# Patient Record
Sex: Male | Born: 1980 | Race: White | Hispanic: No | Marital: Single | Smoking: Never smoker
Health system: Southern US, Community
[De-identification: ages and names within clinical notes are randomized; demographics above are authoritative.]

## PROBLEM LIST (undated history)

## (undated) DIAGNOSIS — Z87442 Personal history of urinary calculi: Secondary | ICD-10-CM

## (undated) DIAGNOSIS — M109 Gout, unspecified: Secondary | ICD-10-CM

## (undated) DIAGNOSIS — E559 Vitamin D deficiency, unspecified: Secondary | ICD-10-CM

## (undated) DIAGNOSIS — T7840XA Allergy, unspecified, initial encounter: Secondary | ICD-10-CM

## (undated) DIAGNOSIS — E669 Obesity, unspecified: Secondary | ICD-10-CM

## (undated) DIAGNOSIS — N289 Disorder of kidney and ureter, unspecified: Secondary | ICD-10-CM

## (undated) DIAGNOSIS — J45909 Unspecified asthma, uncomplicated: Secondary | ICD-10-CM

## (undated) HISTORY — PX: NO PAST SURGERIES: SHX2092

## (undated) HISTORY — DX: Allergy, unspecified, initial encounter: T78.40XA

## (undated) HISTORY — DX: Gout, unspecified: M10.9

## (undated) HISTORY — DX: Vitamin D deficiency, unspecified: E55.9

## (undated) HISTORY — DX: Unspecified asthma, uncomplicated: J45.909

---

## 2007-11-11 ENCOUNTER — Ambulatory Visit: Payer: Self-pay | Admitting: Family Medicine

## 2010-12-10 ENCOUNTER — Emergency Department: Payer: Self-pay

## 2015-03-06 ENCOUNTER — Emergency Department
Admission: EM | Admit: 2015-03-06 | Discharge: 2015-03-06 | Disposition: A | Discharge status: Home / Self Care | Payer: Managed Care, Other (non HMO) | Attending: Emergency Medicine | Admitting: Emergency Medicine

## 2015-03-06 DIAGNOSIS — M109 Gout, unspecified: Secondary | ICD-10-CM

## 2015-03-06 DIAGNOSIS — M10071 Idiopathic gout, right ankle and foot: Secondary | ICD-10-CM | POA: Diagnosis not present

## 2015-03-06 DIAGNOSIS — M79674 Pain in right toe(s): Secondary | ICD-10-CM

## 2015-03-06 DIAGNOSIS — M79671 Pain in right foot: Secondary | ICD-10-CM | POA: Diagnosis present

## 2015-03-06 MED ORDER — IBUPROFEN 800 MG PO TABS
800.0000 mg | ORAL_TABLET | Freq: Once | ORAL | Status: AC
  Administered 2015-03-06: 800 mg via ORAL
  Filled 2015-03-06: qty 1

## 2015-03-06 MED ORDER — IBUPROFEN 800 MG PO TABS: 800.0000 mg | ORAL_TABLET | Freq: Three times a day (TID) | ORAL | Status: DC | PRN

## 2015-03-06 MED ORDER — OXYCODONE-ACETAMINOPHEN 5-325 MG PO TABS
1.0000 | ORAL_TABLET | Freq: Once | ORAL | Status: AC
  Administered 2015-03-06: 1 via ORAL
  Filled 2015-03-06: qty 1

## 2015-03-06 MED ORDER — OXYCODONE-ACETAMINOPHEN 5-325 MG PO TABS: 1.0000 | ORAL_TABLET | ORAL | Status: DC | PRN

## 2015-03-06 NOTE — ED Notes (Signed)
Pt refused to take crutches home. Pt reports having crutches at home.

## 2015-03-06 NOTE — ED Provider Notes (Signed)
[facility] Emergency Department Provider Note  ____________________________________________  Time seen: Approximately 2:57 AM  I have reviewed the triage vital signs and the nursing notes.   HISTORY  Chief Complaint Foot Pain    HPI Cameron Thompson is a 35 y.o. male who presents to the ED from home with a chief complaint of right foot pain. Patient works 2 jobs, one which requires him to wear steel-toed boots, who noted pain and burning to his right great toe upon awakening last evening approximately 8:15PM.Patient denies trauma, fall, injury. Denies associated fever, chills, chest pain, shortness of breath, abdominal pain, nausea, vomiting, diarrhea. Denies prior similar symptoms. Nothing makes his pain better. Walking makes his pain worse.  Past medical history None  There are no active problems to display for this patient.   History reviewed. No pertinent past surgical history.  No current outpatient prescriptions on file.  Allergies Kiwi extract  History reviewed. No pertinent family history.  Social History Social History  Substance Use Topics  . Smoking status: Never Smoker   . Smokeless tobacco: None  . Alcohol Use: No    Review of Systems Constitutional: No fever/chills. Eyes: No visual changes. ENT: No sore throat. Cardiovascular: Denies chest pain. Respiratory: Denies shortness of breath. Gastrointestinal: No abdominal pain.  No nausea, no vomiting.  No diarrhea.  No constipation. Genitourinary: Negative for dysuria. Musculoskeletal: Positive for right great toe pain. Negative for back pain. Skin: Negative for rash. Neurological: Negative for headaches, focal weakness or numbness.  10-point ROS otherwise negative.  ____________________________________________   PHYSICAL EXAM:  VITAL SIGNS: ED Triage Vitals  Enc Vitals Group     BP 03/06/15 0134 142/94 mmHg     Pulse Rate 03/06/15 0134 96     Resp 03/06/15 0134 18    Temp 03/06/15 0134 97.6 F (36.4 C)     Temp Source 03/06/15 0134 Oral     SpO2 03/06/15 0134 97 %     Weight 03/06/15 0134 280 lb (127.007 kg)     Height 03/06/15 0134  (1.778 m)     Head Cir --      Peak Flow --      Pain Score 03/06/15 0135 8     Pain Loc --      Pain Edu? --      Excl. in GC? --     Constitutional: Alert and oriented. Well appearing and in no acute distress. Eyes: Conjunctivae are normal. PERRL. EOMI. Head: Atraumatic. Nose: No congestion/rhinnorhea. Mouth/Throat: Mucous membranes are moist.  Oropharynx non-erythematous. Neck: No stridor.   Cardiovascular: Normal rate, regular rhythm. Grossly normal heart sounds.  Good peripheral circulation. Respiratory: Normal respiratory effort.  No retractions. Lungs CTAB. Gastrointestinal: Soft and nontender. No distention. No abdominal bruits. No CVA tenderness. Musculoskeletal: Right metatarsal with mild warmth, erythema and tenderness to palpation. Full range of motion with moderate pain. No joint effusions. 2+ distal pulses. Supple calf without evidence for compartment syndrome. Symmetrically warm limb without evidence for ischemia. Neurologic:  Normal speech and language. No gross focal neurologic deficits are appreciated.  Skin:  Skin is warm, dry and intact. No rash noted. Psychiatric: Mood and affect are normal. Speech and behavior are normal.  ____________________________________________   LABS (all labs ordered are listed, but only abnormal results are displayed)  Labs Reviewed - No data to display ____________________________________________  EKG  None ____________________________________________  RADIOLOGY  None ____________________________________________   PROCEDURES  Procedure(s) performed: None  Critical Care performed: No  ____________________________________________   INITIAL IMPRESSION / ASSESSMENT AND PLAN / ED COURSE  Pertinent labs & imaging results that were available  during my care of the patient were reviewed by me and considered in my medical decision making (see chart for details).  35 year old male who presents with pain in his right great toe consistent with gout. Will start NSAIDs, analgesia and follow-up with his PCP. Strict return process given. Patient verbalizes understanding and agrees with plan of care. ____________________________________________   FINAL CLINICAL IMPRESSION(S) / ED DIAGNOSES  Final diagnoses:  Acute gout of right foot, unspecified cause  Great toe pain, right      Irean Hong, MD 03/06/15 [phone]

## 2015-03-06 NOTE — Discharge Instructions (Signed)
1. You may take pain medicines as needed (Motrin/Percocet #15). 2. Use crutches as needed to ambulate. 3. Return to the ER for worsening symptoms, increased redness/swelling, fever or other concerns.  Gout Gout is an inflammatory arthritis caused by a buildup of uric acid crystals in the joints. Uric acid is a chemical that is normally present in the blood. When the level of uric acid in the blood is too high it can form crystals that deposit in your joints and tissues. This causes joint redness, soreness, and swelling (inflammation). Repeat attacks are common. Over time, uric acid crystals can form into masses (tophi) near a joint, destroying bone and causing disfigurement. Gout is treatable and often preventable. CAUSES  The disease begins with elevated levels of uric acid in the blood. Uric acid is produced by your body when it breaks down a naturally found substance called purines. Certain foods you eat, such as meats and fish, contain high amounts of purines. Causes of an elevated uric acid level include:  Being passed down from parent to child (heredity).  Diseases that cause increased uric acid production (such as obesity, psoriasis, and certain cancers).  Excessive alcohol use.  Diet, especially diets rich in meat and seafood.  Medicines, including certain cancer-fighting medicines (chemotherapy), water pills (diuretics), and aspirin.  Chronic kidney disease. The kidneys are no longer able to remove uric acid well.  Problems with metabolism. Conditions strongly associated with gout include:  Obesity.  High blood pressure.  High cholesterol.  Diabetes. Not everyone with elevated uric acid levels gets gout. It is not understood why some people get gout and others do not. Surgery, joint injury, and eating too much of certain foods are some of the factors that can lead to gout attacks. SYMPTOMS   An attack of gout comes on quickly. It causes intense pain with redness, swelling,  and warmth in a joint.  Fever can occur.  Often, only one joint is involved. Certain joints are more commonly involved:  Base of the big toe.  Knee.  Ankle.  Wrist.  Finger. Without treatment, an attack usually goes away in a few days to weeks. Between attacks, you usually will not have symptoms, which is different from many other forms of arthritis. DIAGNOSIS  Your caregiver will suspect gout based on your symptoms and exam. In some cases, tests may be recommended. The tests may include:  Blood tests.  Urine tests.  X-rays.  Joint fluid exam. This exam requires a needle to remove fluid from the joint (arthrocentesis). Using a microscope, gout is confirmed when uric acid crystals are seen in the joint fluid. TREATMENT  There are two phases to gout treatment: treating the sudden onset (acute) attack and preventing attacks (prophylaxis).  Treatment of an Acute Attack.  Medicines are used. These include anti-inflammatory medicines or steroid medicines.  An injection of steroid medicine into the affected joint is sometimes necessary.  The painful joint is rested. Movement can worsen the arthritis.  You may use warm or cold treatments on painful joints, depending which works best for you.  Treatment to Prevent Attacks.  If you suffer from frequent gout attacks, your caregiver may advise preventive medicine. These medicines are started after the acute attack subsides. These medicines either help your kidneys eliminate uric acid from your body or decrease your uric acid production. You may need to stay on these medicines for a very long time.  The early phase of treatment with preventive medicine can be associated with an increase  in acute gout attacks. For this reason, during the first few months of treatment, your caregiver may also advise you to take medicines usually used for acute gout treatment. Be sure you understand your caregiver's directions. Your caregiver may make  several adjustments to your medicine dose before these medicines are effective.  Discuss dietary treatment with your caregiver or dietitian. Alcohol and drinks high in sugar and fructose and foods such as meat, poultry, and seafood can increase uric acid levels. Your caregiver or dietitian can advise you on drinks and foods that should be limited. HOME CARE INSTRUCTIONS   Do not take aspirin to relieve pain. This raises uric acid levels.  Only take over-the-counter or prescription medicines for pain, discomfort, or fever as directed by your caregiver.  Rest the joint as much as possible. When in bed, keep sheets and blankets off painful areas.  Keep the affected joint raised (elevated).  Apply warm or cold treatments to painful joints. Use of warm or cold treatments depends on which works best for you.  Use crutches if the painful joint is in your leg.  Drink enough fluids to keep your urine clear or pale yellow. This helps your body get rid of uric acid. Limit alcohol, sugary drinks, and fructose drinks.  Follow your dietary instructions. Pay careful attention to the amount of protein you eat. Your daily diet should emphasize fruits, vegetables, whole grains, and fat-free or low-fat milk products. Discuss the use of coffee, vitamin C, and cherries with your caregiver or dietitian. These may be helpful in lowering uric acid levels.  Maintain a healthy body weight. SEEK MEDICAL CARE IF:   You develop diarrhea, vomiting, or any side effects from medicines.  You do not feel better in 24 hours, or you are getting worse. SEEK IMMEDIATE MEDICAL CARE IF:   Your joint becomes suddenly more tender, and you have chills or a fever. MAKE SURE YOU:   Understand these instructions.  Will watch your condition.  Will get help right away if you are not doing well or get worse.   This information is not intended to replace advice given to you by your health care provider. Make sure you discuss  any questions you have with your health care provider.   Document Released: 12/27/1999 Document Revised: 01/19/2014 Document Reviewed: 08/12/2011 Elsevier Interactive Patient Education [employer]

## 2015-03-06 NOTE — ED Notes (Signed)
Pt arrived to ED with c/o right foot pain. Pt reports "burning" pain at right great toe. Pt denies injury

## 2015-03-06 NOTE — ED Notes (Signed)
MD Sung at bedside. 

## 2015-11-19 ENCOUNTER — Ambulatory Visit
Admission: EM | Admit: 2015-11-19 | Discharge: 2015-11-19 | Disposition: A | Discharge status: Home / Self Care | Payer: Managed Care, Other (non HMO) | Attending: Family Medicine | Admitting: Family Medicine

## 2015-11-19 DIAGNOSIS — J4531 Mild persistent asthma with (acute) exacerbation: Secondary | ICD-10-CM

## 2015-11-19 MED ORDER — AZITHROMYCIN 250 MG PO TABS: ORAL_TABLET | ORAL | 0 refills | Status: DC

## 2015-11-19 MED ORDER — PREDNISONE 20 MG PO TABS: ORAL_TABLET | ORAL | 0 refills | Status: DC

## 2015-11-19 MED ORDER — IPRATROPIUM-ALBUTEROL 0.5-2.5 (3) MG/3ML IN SOLN
3.0000 mL | Freq: Once | RESPIRATORY_TRACT | Status: AC
  Administered 2015-11-19: 3 mL via RESPIRATORY_TRACT

## 2015-11-19 NOTE — ED Triage Notes (Signed)
Pt c/o cough that has been around for about 2 weeks, he says he cant breath and his chest is tight.

## 2015-11-19 NOTE — ED Provider Notes (Signed)
[facility]    CSN: [id number] Arrival date & time: 11/19/15  1356     History   Chief Complaint Chief Complaint  Patient presents with  . Cough    HPI Cameron Thompson is a [age] y.o. male.   The history is provided by the patient.  Cough  Cough characteristics:  Productive Associated symptoms: wheezing   URI  Presenting symptoms: cough and fatigue   Severity:  Moderate Onset quality:  Gradual Duration:  2 weeks Timing:  Constant Progression:  Worsening Chronicity:  New Relieved by:  Prescription medications (mild relief with albuterol inhaler at home) Associated symptoms: wheezing   Risk factors: not elderly, no chronic cardiac disease, no chronic kidney disease, no chronic respiratory disease, no diabetes mellitus, no immunosuppression, no recent illness, no recent travel and no sick contacts     History reviewed. No pertinent past medical history.  There are no active problems to display for this patient.   History reviewed. No pertinent surgical history.     Home Medications    Prior to Admission medications   Medication Sig Start Date End Date Taking? Authorizing Provider  azithromycin (ZITHROMAX Z-PAK) 250 MG tablet 2 tabs po once day 1, then 1 tab po qd for next 4 days 11/19/15   Payton Mccallumrlando Carling Liberman, MD  ibuprofen (ADVIL,MOTRIN) 800 MG tablet Take 1 tablet (800 mg total) by mouth every 8 (eight) hours as needed for moderate pain. 03/06/15   Irean HongJade J Sung, MD  predniSONE (DELTASONE) 20 MG tablet 3 tabs po qd for 2 days, then 2 tabs po qd for 3 days, then 1 tab po qd for 3 days, then half a tab po qd for 2 days 11/19/15   Payton Mccallumrlando Kendan Cornforth, MD    Family History History reviewed. No pertinent family history.  Social History Social History  Substance Use Topics  . Smoking status: Never Smoker  . Smokeless tobacco: Never Used  . Alcohol use No     Allergies   Kiwi extract   Review of Systems Review of Systems  Constitutional: Positive for fatigue.   Respiratory: Positive for cough and wheezing.      Physical Exam Triage Vital Signs ED Triage Vitals  Enc Vitals Group     BP 11/19/15 1534 (!) 158/104     Pulse Rate 11/19/15 1534 91     Resp 11/19/15 1534 20     Temp 11/19/15 1534 98.7 F (37.1 C)     Temp Source 11/19/15 1534 Oral     SpO2 11/19/15 1534 96 %     Weight 11/19/15 1534 (!) 318 lb (144.2 kg)     Height 11/19/15 1534 5\' 9"  (1.753 m)     Head Circumference --      Peak Flow --      Pain Score 11/19/15 1536 9     Pain Loc --      Pain Edu? --      Excl. in GC? --    No data found.   Updated Vital Signs BP (!) 158/104 (BP Location: Left Arm)   Pulse 91   Temp 98.7 F (37.1 C) (Oral)   Resp 20   Ht 5\' 9"  (1.753 m)   Wt (!) 318 lb (144.2 kg)   SpO2 96%   BMI 46.96 kg/m   Visual Acuity Right Eye Distance:   Left Eye Distance:   Bilateral Distance:    Right Eye Near:   Left Eye Near:    Bilateral Near:  Physical Exam  Constitutional: He appears well-developed and well-nourished. No distress.  HENT:  Head: Normocephalic and atraumatic.  Right Ear: Tympanic membrane, external ear and ear canal normal.  Left Ear: Tympanic membrane, external ear and ear canal normal.  Nose: Nose normal.  Mouth/Throat: Uvula is midline, oropharynx is clear and moist and mucous membranes are normal. No oropharyngeal exudate or tonsillar abscesses.  Eyes: Conjunctivae and EOM are normal. Pupils are equal, round, and reactive to light. Right eye exhibits no discharge. Left eye exhibits no discharge. No scleral icterus.  Neck: Normal range of motion. Neck supple. No tracheal deviation present. No thyromegaly present.  Cardiovascular: Normal rate, regular rhythm and normal heart sounds.   Pulmonary/Chest: Effort normal. No stridor. No respiratory distress. He has wheezes (diffuse, bilateral, expiratory). He has no rales. He exhibits no tenderness.  Lymphadenopathy:    He has no cervical adenopathy.  Neurological: He is  alert.  Skin: Skin is warm and dry. No rash noted. He is not diaphoretic.  Nursing note and vitals reviewed.    UC Treatments / Results  Labs (all labs ordered are listed, but only abnormal results are displayed) Labs Reviewed - No data to display  EKG  EKG Interpretation None       Radiology No results found.  Procedures Procedures (including critical care time)  Medications Ordered in UC Medications  ipratropium-albuterol (DUONEB) 0.5-2.5 (3) MG/3ML nebulizer solution 3 mL (3 mLs Nebulization Given 11/19/15 1608)     Initial Impression / Assessment and Plan / UC Course  I have reviewed the triage vital signs and the nursing notes.  Pertinent labs & imaging results that were available during my care of the patient were reviewed by me and considered in my medical decision making (see chart for details).  Clinical Course       Final Clinical Impressions(s) / UC Diagnoses   Final diagnoses:  Mild persistent asthma with exacerbation    New Prescriptions Discharge Medication List as of 11/19/2015  4:21 PM    START taking these medications   Details  azithromycin (ZITHROMAX Z-PAK) 250 MG tablet 2 tabs po once day 1, then 1 tab po qd for next 4 days, Normal    predniSONE (DELTASONE) 20 MG tablet 3 tabs po qd for 2 days, then 2 tabs po qd for 3 days, then 1 tab po qd for 3 days, then half a tab po qd for 2 days, Normal       1. diagnosis reviewed with patient; given duoneb x 1 with improvement 2.  rx as per orders above; reviewed possible side effects, interactions, risks and benefits; continue current home albuterol inhaler  3. Recommend supportive treatment with increased fluids 4. Follow-up prn if symptoms worsen or don't improve   Payton Mccallumrlando Yaw Escoto, MD 11/19/15 1635

## 2015-11-22 ENCOUNTER — Telehealth: Payer: Self-pay | Admitting: *Deleted

## 2016-03-14 ENCOUNTER — Encounter: Payer: Self-pay | Admitting: Emergency Medicine

## 2016-03-14 ENCOUNTER — Emergency Department: Payer: 59

## 2016-03-14 ENCOUNTER — Emergency Department
Admission: EM | Admit: 2016-03-14 | Discharge: 2016-03-14 | Disposition: A | Discharge status: Home / Self Care | Payer: 59 | Attending: Emergency Medicine | Admitting: Emergency Medicine

## 2016-03-14 DIAGNOSIS — R109 Unspecified abdominal pain: Secondary | ICD-10-CM | POA: Diagnosis present

## 2016-03-14 DIAGNOSIS — N200 Calculus of kidney: Secondary | ICD-10-CM | POA: Diagnosis not present

## 2016-03-14 LAB — BASIC METABOLIC PANEL
Anion gap: 11 (ref 5–15)
BUN: 12 mg/dL (ref 6–20)
CHLORIDE: 103 mmol/L (ref 101–111)
CO2: 25 mmol/L (ref 22–32)
Calcium: 9.5 mg/dL (ref 8.9–10.3)
Creatinine, Ser: 0.76 mg/dL (ref 0.61–1.24)
GFR calc Af Amer: >60 mL/min (ref >60)
GFR calc non Af Amer: >60 mL/min (ref >60)
GLUCOSE: 112 mg/dL — AB (ref 65–99)
POTASSIUM: 3.5 mmol/L (ref 3.5–5.1)
Sodium: 139 mmol/L (ref 135–145)

## 2016-03-14 LAB — CBC
HEMATOCRIT: 43.3 % (ref 40.0–52.0)
Hemoglobin: 14.8 g/dL (ref 13.0–18.0)
MCH: 29.3 pg (ref 26.0–34.0)
MCHC: 34.2 g/dL (ref 32.0–36.0)
MCV: 85.6 fL (ref 80.0–100.0)
Platelets: 360 10*3/uL (ref 150–440)
RBC: 5.06 MIL/uL (ref 4.40–5.90)
RDW: 14 % (ref 11.5–14.5)
WBC: 11 10*3/uL — ABNORMAL HIGH (ref 3.8–10.6)

## 2016-03-14 MED ORDER — OXYCODONE-ACETAMINOPHEN 5-325 MG PO TABS
1.0000 | ORAL_TABLET | Freq: Once | ORAL | Status: AC
Start: 2016-03-14 — End: 2016-03-14
  Administered 2016-03-14: 1 via ORAL
  Filled 2016-03-14: qty 1

## 2016-03-14 MED ORDER — FENTANYL CITRATE (PF) 100 MCG/2ML IJ SOLN
50.0000 ug | INTRAMUSCULAR | Status: AC | PRN
  Administered 2016-03-14 (×2): 50 ug via INTRAVENOUS
  Filled 2016-03-14 (×2): qty 2

## 2016-03-14 MED ORDER — IBUPROFEN 600 MG PO TABS: 600.0000 mg | ORAL_TABLET | Freq: Three times a day (TID) | ORAL | 0 refills | Status: DC | PRN

## 2016-03-14 MED ORDER — TAMSULOSIN HCL 0.4 MG PO CAPS: 0.4000 mg | ORAL_CAPSULE | Freq: Every day | ORAL | 0 refills | Status: DC

## 2016-03-14 MED ORDER — KETOROLAC TROMETHAMINE 30 MG/ML IJ SOLN
15.0000 mg | Freq: Once | INTRAMUSCULAR | Status: AC
  Administered 2016-03-14: 15 mg via INTRAVENOUS
  Filled 2016-03-14: qty 1

## 2016-03-14 MED ORDER — OXYCODONE-ACETAMINOPHEN 5-325 MG PO TABS
1.0000 | ORAL_TABLET | ORAL | 0 refills | Status: DC | PRN
Start: 2016-03-14 — End: 2017-04-07

## 2016-03-14 MED ORDER — SODIUM CHLORIDE 0.9 % IV BOLUS (SEPSIS)
1000.0000 mL | Freq: Once | INTRAVENOUS | Status: AC
  Administered 2016-03-14: 1000 mL via INTRAVENOUS

## 2016-03-14 NOTE — ED Notes (Signed)
Pt alert and oriented X4, active, cooperative, pt in NAD. RR even and unlabored, color WNL.  Pt informed to return if any life threatening symptoms occur.   

## 2016-03-14 NOTE — ED Notes (Signed)
Pt unable to provide urine sample at this time 

## 2016-03-14 NOTE — ED Notes (Signed)
ED Provider at bedside. 

## 2016-03-14 NOTE — ED Triage Notes (Signed)
Right flank pain that started around noon today, radiating to stomach as well. Pt c/o nausea but no vomiting. Pt unable to sit still in wheelchair. No hx of kidney stone in the past. Denies any urinary sxs.

## 2016-03-14 NOTE — ED Provider Notes (Signed)
[facility] Emergency Department Provider Note  ____________________________________________   First MD Initiated Contact with Patient 03/14/16 1559     (approximate)  I have reviewed the triage vital signs and the nursing notes.   HISTORY  Chief Complaint Flank Pain   HPI Revonda StandardBrandon Faivre is a 36 y.o. male who comes to the emergency department with 3 hours of severe aching throbbing right flank pain radiating to his right groin unlike any pain he's ever had before. Pain is constant. Nothing makes it better or worse. He reports no dysuria frequency or hesitancy. He's never had abdominal surgery before.   History reviewed. No pertinent past medical history.  There are no active problems to display for this patient.   History reviewed. No pertinent surgical history.  Prior to Admission medications   Medication Sig Start Date End Date Taking? Authorizing Provider  azithromycin (ZITHROMAX Z-PAK) 250 MG tablet 2 tabs po once day 1, then 1 tab po qd for next 4 days 11/19/15   Payton Mccallumrlando Conty, MD  ibuprofen (ADVIL,MOTRIN) 600 MG tablet Take 1 tablet (600 mg total) by mouth every 8 (eight) hours as needed. 03/14/16   Merrily BrittleNeil Zeya Balles, MD  oxyCODONE-acetaminophen (ROXICET) 5-325 MG tablet Take 1 tablet by mouth every 4 (four) hours as needed for severe pain. 03/14/16   Merrily BrittleNeil Cece Milhouse, MD  predniSONE (DELTASONE) 20 MG tablet 3 tabs po qd for 2 days, then 2 tabs po qd for 3 days, then 1 tab po qd for 3 days, then half a tab po qd for 2 days 11/19/15   Payton Mccallumrlando Conty, MD  tamsulosin (FLOMAX) 0.4 MG CAPS capsule Take 1 capsule (0.4 mg total) by mouth daily. 03/14/16   Merrily BrittleNeil Denisia Harpole, MD    Allergies Kiwi extract  History reviewed. No pertinent family history.  Social History Social History  Substance Use Topics  . Smoking status: Never Smoker  . Smokeless tobacco: Never Used  . Alcohol use No    Review of Systems Constitutional: No fever/chills Eyes: No visual  changes. ENT: No sore throat. Cardiovascular: Denies chest pain. Respiratory: Denies shortness of breath. Gastrointestinal: Positive abdominal pain.  Positive nausea, positive vomiting.  No diarrhea.  No constipation. Genitourinary: Negative for dysuria. Musculoskeletal: Negative for back pain. Skin: Negative for rash. Neurological: Negative for headaches, focal weakness or numbness.  10-point ROS otherwise negative.  ____________________________________________   PHYSICAL EXAM:  VITAL SIGNS: ED Triage Vitals  Enc Vitals Group     BP 03/14/16 1342 (!) 156/75     Pulse Rate 03/14/16 1342 69     Resp 03/14/16 1342 (!) 22     Temp 03/14/16 1342 97.8 F (36.6 C)     Temp Source 03/14/16 1342 Oral     SpO2 03/14/16 1342 96 %     Weight 03/14/16 1342 (!) 321 lb (145.6 kg)     Height 03/14/16 1342 5\' 10"  (1.778 m)     Head Circumference --      Peak Flow --      Pain Score 03/14/16 1344 10     Pain Loc --      Pain Edu? --      Excl. in GC? --     Constitutional: Alert and oriented x 4 well appearing nontoxic no diaphoresis speaks in full, clear sentences Eyes: PERRL EOMI. Head: Atraumatic. Nose: No congestion/rhinnorhea. Mouth/Throat: No trismus Neck: No stridor.   Cardiovascular: Normal rate, regular rhythm. Grossly normal heart sounds.  Good peripheral circulation. Respiratory: Normal respiratory effort.  No retractions. Lungs CTAB and moving good air Gastrointestinal: Obese Soft nondistended nontender no rebound no guarding no peritonitis no McBurney's tenderness negative Rovsing's no costovertebral tenderness negative Murphy's Musculoskeletal: No lower extremity edema   Neurologic:  Normal speech and language. No gross focal neurologic deficits are appreciated. Skin:  Skin is warm, dry and intact. No rash noted. Psychiatric: Mood and affect are normal. Speech and behavior are normal.  ____________________________________________   LABS (all labs ordered are listed,  but only abnormal results are displayed)  Labs Reviewed  BASIC METABOLIC PANEL - Abnormal; Notable for the following:       Result Value   Glucose, Bld 112 (*)    All other components within normal limits  CBC - Abnormal; Notable for the following:    WBC 11.0 (*)    All other components within normal limits  URINALYSIS, COMPLETE (UACMP) WITH MICROSCOPIC   ____________________________________________  EKG   ____________________________________________  RADIOLOGY  Ct Renal Stone Study  Result Date: 03/14/2016 CLINICAL DATA:  Right flank pain that started around noon today, radiating to stomach as well. Pt c/o nausea but no vomiting. Pt unable to sit still in wheelchair. No hx of kidney stone in the past. Denies any urinary sxs. EXAM: CT ABDOMEN AND PELVIS WITHOUT CONTRAST TECHNIQUE: Multidetector CT imaging of the abdomen and pelvis was performed following the standard protocol without IV contrast. COMPARISON:  None. FINDINGS: Lower chest: Clear lung bases. Normal heart size without pericardial or pleural effusion. Hepatobiliary: Normal liver. Normal gallbladder, without biliary ductal dilatation. Pancreas: Normal, without mass or ductal dilatation. Spleen: Normal in size, without focal abnormality. Adrenals/Urinary Tract: Normal adrenal glands. Punctate lower pole left renal collecting system stone. Mild right-sided hydroureteronephrosis to the level of a 5 mm proximal to mid right ureteric stone on image 59/series 2. No distal ureteric or bladder calculi. Stomach/Bowel: Normal stomach, without wall thickening. Moderate amount of stool within the rectum and sigmoid. Normal terminal ileum and appendix. Normal small bowel. Vascular/Lymphatic: Normal caliber of the aorta and branch vessels. No abdominopelvic adenopathy. Reproductive: Normal prostate. Other: No significant free fluid. Periumbilical ventral abdominal wall hernia contains fat. Increased density within the herniated fat. Example image  61/series 2. Musculoskeletal: No acute osseous abnormality. IMPRESSION: 1. Mild right-sided urinary tract obstruction secondary to a mid to distal right ureteric calculus. 2. Left nephrolithiasis. 3. Rectosigmoid stool burden suggests constipation. 4. Fat containing periumbilical ventral abdominal wall hernia. Given increased density within the herniated fat, recommend physical exam correlation to exclude a component of strangulation. Electronically Signed   By: Jeronimo Greaves M.D.   On: 03/14/2016 16:25    ____________________________________________   PROCEDURES  Procedure(s) performed: no  Procedures  Critical Care performed: no  ____________________________________________   INITIAL IMPRESSION / ASSESSMENT AND PLAN / ED COURSE  Pertinent labs & imaging results that were available during my care of the patient were reviewed by me and considered in my medical decision making (see chart for details).    ----------------------------------------- 5:12 PM on 03/14/2016 -----------------------------------------  The patient is well-appearing with no signs of infection. He has a 5 mm stone. Ureteral. His pain is controlled and he is able to eat and drink. I've given him strict return precautions and will discharge him home with tamsulosin, Percocet, and ibuprofen. He is medically stable for outpatient management understands and agrees the plan.     ____________________________________________   FINAL CLINICAL IMPRESSION(S) / ED DIAGNOSES  Final diagnoses:  Nephrolithiasis      NEW MEDICATIONS STARTED  DURING THIS VISIT:  New Prescriptions   IBUPROFEN (ADVIL,MOTRIN) 600 MG TABLET    Take 1 tablet (600 mg total) by mouth every 8 (eight) hours as needed.   OXYCODONE-ACETAMINOPHEN (ROXICET) 5-325 MG TABLET    Take 1 tablet by mouth every 4 (four) hours as needed for severe pain.   TAMSULOSIN (FLOMAX) 0.4 MG CAPS CAPSULE    Take 1 capsule (0.4 mg total) by mouth daily.      Note:  This document was prepared using Dragon voice recognition software and may include unintentional dictation errors.     Merrily Brittle, MD 03/14/16 1728

## 2016-03-14 NOTE — Discharge Instructions (Signed)
It is normal for pain from a kidney stone to last up to a full week. If yout pain does not go away after a week please make an appointment to see a urologist for a recheck. Take your Percocet only if your pain is severe otherwise please take your ibuprofen and your Flomax.  If you develop a fever or chills at any point please, emergently back to the emergency department. If you develop an infection behind her kidney stone this can be life-threatening.  It was a pleasure to take care of you today, and thank you for coming to our emergency department.  If you have any questions or concerns before leaving please ask the nurse to grab me and I'm more than happy to go through your aftercare instructions again.  If you were prescribed any opioid pain medication today such as Norco, Vicodin, Percocet, morphine, hydrocodone, or oxycodone please make sure you do not drive when you are taking this medication as it can alter your ability to drive safely.  If you have any concerns once you are home that you are not improving or are in fact getting worse before you can make it to your follow-up appointment, please do not hesitate to call 911 and come back for further evaluation.  Merrily BrittleNeil Mykelle Cockerell MD  Results for orders placed or performed during the hospital encounter of 03/14/16  Basic metabolic panel  Result Value Ref Range   Sodium 139 135 - 145 mmol/L   Potassium 3.5 3.5 - 5.1 mmol/L   Chloride 103 101 - 111 mmol/L   CO2 25 22 - 32 mmol/L   Glucose, Bld 112 (H) 65 - 99 mg/dL   BUN 12 6 - 20 mg/dL   Creatinine, Ser 2.950.76 0.61 - 1.24 mg/dL   Calcium 9.5 8.9 - 62.110.3 mg/dL   GFR calc non Af Amer >60 >60 mL/min   GFR calc Af Amer >60 >60 mL/min   Anion gap 11 5 - 15  CBC  Result Value Ref Range   WBC 11.0 (H) 3.8 - 10.6 K/uL   RBC 5.06 4.40 - 5.90 MIL/uL   Hemoglobin 14.8 13.0 - 18.0 g/dL   HCT 30.843.3 65.740.0 - 84.652.0 %   MCV 85.6 80.0 - 100.0 fL   MCH 29.3 26.0 - 34.0 pg   MCHC 34.2 32.0 - 36.0 g/dL   RDW  96.214.0 95.211.5 - 84.114.5 %   Platelets 360 150 - 440 K/uL   Ct Renal Stone Study  Result Date: 03/14/2016 CLINICAL DATA:  Right flank pain that started around noon today, radiating to stomach as well. Pt c/o nausea but no vomiting. Pt unable to sit still in wheelchair. No hx of kidney stone in the past. Denies any urinary sxs. EXAM: CT ABDOMEN AND PELVIS WITHOUT CONTRAST TECHNIQUE: Multidetector CT imaging of the abdomen and pelvis was performed following the standard protocol without IV contrast. COMPARISON:  None. FINDINGS: Lower chest: Clear lung bases. Normal heart size without pericardial or pleural effusion. Hepatobiliary: Normal liver. Normal gallbladder, without biliary ductal dilatation. Pancreas: Normal, without mass or ductal dilatation. Spleen: Normal in size, without focal abnormality. Adrenals/Urinary Tract: Normal adrenal glands. Punctate lower pole left renal collecting system stone. Mild right-sided hydroureteronephrosis to the level of a 5 mm proximal to mid right ureteric stone on image 59/series 2. No distal ureteric or bladder calculi. Stomach/Bowel: Normal stomach, without wall thickening. Moderate amount of stool within the rectum and sigmoid. Normal terminal ileum and appendix. Normal small bowel. Vascular/Lymphatic: Normal caliber  of the aorta and branch vessels. No abdominopelvic adenopathy. Reproductive: Normal prostate. Other: No significant free fluid. Periumbilical ventral abdominal wall hernia contains fat. Increased density within the herniated fat. Example image 61/series 2. Musculoskeletal: No acute osseous abnormality. IMPRESSION: 1. Mild right-sided urinary tract obstruction secondary to a mid to distal right ureteric calculus. 2. Left nephrolithiasis. 3. Rectosigmoid stool burden suggests constipation. 4. Fat containing periumbilical ventral abdominal wall hernia. Given increased density within the herniated fat, recommend physical exam correlation to exclude a component of  strangulation. Electronically Signed   By: Jeronimo Greaves M.D.   On: 03/14/2016 16:25

## 2016-03-14 NOTE — ED Notes (Signed)
Right sided flank pain that began this AM. Pt reports clean and clear urine sample PTA.

## 2016-03-14 NOTE — ED Notes (Signed)
While in [place] had discussion with pt concerning the delay in room placement and advised pt that we would stay aware of his pain level and get him back to a treatment room as soon as possible. "First Nurse" advised.

## 2016-03-14 NOTE — ED Notes (Signed)
Notified Dr. Alphonzo LemmingsMcShane of continued pain level and given 2 doses of Fentanyl per protocol.  New orders received for Toradol, Fluids and CT scan

## 2017-04-07 ENCOUNTER — Encounter: Payer: Self-pay | Admitting: *Deleted

## 2017-04-07 ENCOUNTER — Other Ambulatory Visit: Payer: Self-pay

## 2017-04-07 ENCOUNTER — Ambulatory Visit
Admission: EM | Admit: 2017-04-07 | Discharge: 2017-04-07 | Disposition: A | Discharge status: Home / Self Care | Payer: BLUE CROSS/BLUE SHIELD | Attending: Internal Medicine | Admitting: Internal Medicine

## 2017-04-07 DIAGNOSIS — J209 Acute bronchitis, unspecified: Secondary | ICD-10-CM | POA: Diagnosis not present

## 2017-04-07 DIAGNOSIS — J9801 Acute bronchospasm: Secondary | ICD-10-CM | POA: Diagnosis not present

## 2017-04-07 HISTORY — DX: Disorder of kidney and ureter, unspecified: N28.9

## 2017-04-07 MED ORDER — ALBUTEROL SULFATE HFA 108 (90 BASE) MCG/ACT IN AERS: 1.0000 | INHALATION_SPRAY | RESPIRATORY_TRACT | 0 refills | Status: DC | PRN

## 2017-04-07 MED ORDER — IPRATROPIUM-ALBUTEROL 0.5-2.5 (3) MG/3ML IN SOLN
3.0000 mL | Freq: Once | RESPIRATORY_TRACT | Status: AC
  Administered 2017-04-07: 3 mL via RESPIRATORY_TRACT

## 2017-04-07 NOTE — ED Triage Notes (Signed)
PAtient started having symptom of SOB last pm. 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

## 2017-04-07 NOTE — ED Provider Notes (Signed)
[facility]    CSN: [id number] Arrival date & time: 04/07/17  1430     History   Chief Complaint Chief Complaint  Patient presents with  . Shortness of Breath    HPI Cameron Thompson is a 37 y.o. male.   Patient presents with history of seasonal bronchospasm, actually has similar symptoms every spring.  Has waked up the last couple of nights with breathlessness, may be a little bit of cough.  Has had some runny/congested nose and itchy eyes for a couple of weeks.  No fever, no malaise.  Feels okay otherwise.  No GI symptoms    HPI  Past Medical History:  Diagnosis Date  . Renal disorder     History reviewed. No pertinent surgical history.     Home Medications    Prior to Admission medications   Medication Sig Start Date End Date Taking? Authorizing Provider  albuterol (PROVENTIL HFA;VENTOLIN HFA) 108 (90 Base) MCG/ACT inhaler Inhale 1-2 puffs into the lungs every 4 (four) hours as needed for wheezing or shortness of breath. 04/07/17   Isa Rankin, MD    Family History History reviewed. No pertinent family history.  Social History Social History   Tobacco Use  . Smoking status: Never Smoker  . Smokeless tobacco: Never Used  Substance Use Topics  . Alcohol use: No  . Drug use: No     Allergies   Kiwi extract   Review of Systems Review of Systems   Physical Exam Triage Vital Signs ED Triage Vitals  Enc Vitals Group     BP 04/07/17 1438 (!) 149/102     Pulse Rate 04/07/17 1438 75     Resp 04/07/17 1438 16     Temp 04/07/17 1438 97.8 F (36.6 C)     Temp Source 04/07/17 1438 Oral     SpO2 04/07/17 1438 96 %     Weight 04/07/17 1440 (!) 317 lb (143.8 kg)     Height 04/07/17 1440 5\' 9"  (1.753 m)     Pain Score 04/07/17 1438 0     Pain Loc --    Updated Vital Signs BP (!) 149/102 (BP Location: Left Arm)   Pulse 75   Temp 97.8 F (36.6 C) (Oral)   Resp 16   Ht 5\' 9"  (1.753 m)   Wt (!) 317 lb (143.8 kg)   SpO2 96%   BMI  46.81 kg/m  Physical Exam  Constitutional: He is oriented to person, place, and time. No distress.  Alert, nicely groomed  HENT:  Head: Atraumatic.  Bilateral TMs are moderately dull, red tinged Moderate nasal congestion bilaterally, little bit inflamed mucosa Throat is moderately injected  Eyes:  Conjugate gaze, little bit of eye redness bilaterally, little bit of watering, no drainage  Neck: Neck supple.  Cardiovascular: Normal rate and regular rhythm.  Pulmonary/Chest: No respiratory distress. He has no wheezes. He has no rales.  Coarse but symmetric breath sounds throughout, somewhat diminished posteriorly  Abdominal: He exhibits no distension.  Musculoskeletal: Normal range of motion.  Neurological: He is alert and oriented to person, place, and time.  Skin: Skin is warm and dry.  No cyanosis  Nursing note and vitals reviewed.    UC Treatments / Results   Procedures Procedures (including critical care time)  Medications Ordered in UC Medications  ipratropium-albuterol (DUONEB) 0.5-2.5 (3) MG/3ML nebulizer solution 3 mL (3 mLs Nebulization Given 04/07/17 1514)    Final Clinical Impressions(s) / UC Diagnoses   Final diagnoses:  Bronchitis with bronchospasm   Breathing treatment given at the urgent care today.  Prescription for albuterol inhaler was sent to the pharmacy.  Anticipate gradual improvement in cough, breathlessness over the next several days.  Recheck for new fever >100.5, increasing phlegm production/nasal discharge, or if not starting to improve in a few days.     ED Discharge Orders        Ordered    albuterol (PROVENTIL HFA;VENTOLIN HFA) 108 (90 Base) MCG/ACT inhaler  Every 4 hours PRN     04/07/17 1532         Isa RankinMurray, Florance Paolillo Wilson, MD 04/10/17 1053

## 2017-04-07 NOTE — Discharge Instructions (Addendum)
Breathing treatment given at the urgent care today.  Prescription for albuterol inhaler was sent to the pharmacy.  Anticipate gradual improvement in cough, breathlessness over the next several days.  Recheck for new fever >100.5, increasing phlegm production/nasal discharge, or if not starting to improve in a few days.

## 2017-07-17 ENCOUNTER — Other Ambulatory Visit: Payer: Self-pay

## 2017-07-17 ENCOUNTER — Ambulatory Visit
Admission: EM | Admit: 2017-07-17 | Discharge: 2017-07-17 | Disposition: A | Discharge status: Home / Self Care | Payer: BLUE CROSS/BLUE SHIELD | Attending: Family Medicine | Admitting: Family Medicine

## 2017-07-17 DIAGNOSIS — M25572 Pain in left ankle and joints of left foot: Secondary | ICD-10-CM

## 2017-07-17 DIAGNOSIS — M10072 Idiopathic gout, left ankle and foot: Secondary | ICD-10-CM

## 2017-07-17 MED ORDER — HYDROCODONE-ACETAMINOPHEN 5-325 MG PO TABS: ORAL_TABLET | ORAL | 0 refills | Status: DC

## 2017-07-17 MED ORDER — PREDNISONE 20 MG PO TABS: ORAL_TABLET | ORAL | 0 refills | Status: DC

## 2017-07-17 NOTE — ED Provider Notes (Addendum)
[facility]    CSN: [id number] Arrival date & time: 07/17/17  0954     History   Chief Complaint Chief Complaint  Patient presents with  . Ankle Pain    HPI Cameron Thompson is a 37 y.o. male.   The history is provided by the patient.  Ankle Pain  Location:  Ankle Time since incident:  12 hours Injury: no   Ankle location:  L ankle Pain details:    Quality:  Aching Chronicity:  Recurrent Dislocation: no   Foreign body present:  No foreign bodies Worsened by:  Bearing weight and activity Ineffective treatments:  None tried Associated symptoms: swelling   Risk factors: no frequent fractures, no known bone disorder, no obesity and no recent illness   Risk factors comment:  History of gout (with similar symptoms)   Past Medical History:  Diagnosis Date  . Renal disorder     There are no active problems to display for this patient.   Past Surgical History:  Procedure Laterality Date  . NO PAST SURGERIES         Home Medications    Prior to Admission medications   Medication Sig Start Date End Date Taking? Authorizing Provider  albuterol (PROVENTIL HFA;VENTOLIN HFA) 108 (90 Base) MCG/ACT inhaler Inhale 1-2 puffs into the lungs every 4 (four) hours as needed for wheezing or shortness of breath. 04/07/17   Isa Rankin, MD  HYDROcodone-acetaminophen (NORCO/VICODIN) 5-325 MG tablet 1-2 tabs po bid prn 07/17/17   Payton Mccallum, MD  predniSONE (DELTASONE) 20 MG tablet 3 tabs po qd x 2 days, then 2 tabs po qd x 2 days, then 1 tab po qd x 2 days, then half a tab po qd x 2 days 07/17/17   Payton Mccallum, MD    Family History History reviewed. No pertinent family history.  Social History Social History   Tobacco Use  . Smoking status: Never Smoker  . Smokeless tobacco: Never Used  Substance Use Topics  . Alcohol use: Yes    Comment: seldom  . Drug use: No     Allergies   Kiwi extract   Review of Systems Review of Systems   Physical  Exam Triage Vital Signs ED Triage Vitals  Enc Vitals Group     BP 07/17/17 1010 (!) 142/96     Pulse Rate 07/17/17 1010 82     Resp 07/17/17 1010 17     Temp 07/17/17 1010 97.7 F (36.5 C)     Temp Source 07/17/17 1010 Oral     SpO2 07/17/17 1010 97 %     Weight 07/17/17 1009 295 lb (133.8 kg)     Height 07/17/17 1009 5\' 10"  (1.778 m)     Head Circumference --      Peak Flow --      Pain Score 07/17/17 1009 10     Pain Loc --      Pain Edu? --      Excl. in GC? --    No data found.  Updated Vital Signs BP (!) 142/96 (BP Location: Right Arm)   Pulse 82   Temp 97.7 F (36.5 C) (Oral)   Resp 17   Ht 5\' 10"  (1.778 m)   Wt 295 lb (133.8 kg)   SpO2 97%   BMI 42.33 kg/m   Visual Acuity Right Eye Distance:   Left Eye Distance:   Bilateral Distance:    Right Eye Near:   Left Eye Near:  Bilateral Near:     Physical Exam  Constitutional: He appears well-developed and well-nourished. No distress.  Musculoskeletal:       Left ankle: He exhibits swelling (mild). He exhibits normal range of motion, no ecchymosis, no deformity, no laceration and normal pulse. Tenderness. Achilles tendon normal.  Skin: He is not diaphoretic.  Vitals reviewed.    UC Treatments / Results  Labs (all labs ordered are listed, but only abnormal results are displayed) Labs Reviewed - No data to display  EKG None  Radiology No results found.  Procedures Procedures (including critical care time)  Medications Ordered in UC Medications - No data to display  Initial Impression / Assessment and Plan / UC Course  I have reviewed the triage vital signs and the nursing notes.  Pertinent labs & imaging results that were available during my care of the patient were reviewed by me and considered in my medical decision making (see chart for details).      Final Clinical Impressions(s) / UC Diagnoses   Final diagnoses:  Acute left ankle pain  (likely gout)  ED Prescriptions     Medication Sig Dispense Auth. Provider   predniSONE (DELTASONE) 20 MG tablet 3 tabs po qd x 2 days, then 2 tabs po qd x 2 days, then 1 tab po qd x 2 days, then half a tab po qd x 2 days 13 tablet Dessirae Scarola, Pamala Hurryrlando, MD   HYDROcodone-acetaminophen (NORCO/VICODIN) 5-325 MG tablet 1-2 tabs po bid prn 6 tablet Jarrick Fjeld, Pamala Hurryrlando, MD      1. diagnosis reviewed with patient 2. rx as per orders above; reviewed possible side effects, interactions, risks and benefits  3. Recommend supportive treatment with rest, otc analgesics prn  4. Follow-up prn if symptoms worsen or don't improve Controlled Substance Prescriptions [place] Controlled Substance Registry consulted? Not Applicable   Payton Mccallumonty, [place] Searles, MD 07/17/17 1114    Payton Mccallumonty, Youssef Footman, MD 07/17/17 1115

## 2017-07-17 NOTE — ED Triage Notes (Signed)
Patient complains of left ankle pain that he thinks may be gout. Patient states that this started around 7am this morning. Patient states that ankle was fine last night when he was dancing.

## 2019-01-17 ENCOUNTER — Other Ambulatory Visit: Payer: Self-pay

## 2019-01-17 ENCOUNTER — Encounter: Payer: Self-pay | Admitting: Emergency Medicine

## 2019-01-17 ENCOUNTER — Ambulatory Visit
Admission: EM | Admit: 2019-01-17 | Discharge: 2019-01-17 | Disposition: A | Discharge status: Home / Self Care | Payer: Managed Care, Other (non HMO) | Attending: Family Medicine | Admitting: Family Medicine

## 2019-01-17 DIAGNOSIS — L089 Local infection of the skin and subcutaneous tissue, unspecified: Secondary | ICD-10-CM | POA: Diagnosis not present

## 2019-01-17 MED ORDER — MUPIROCIN 2 % EX OINT: 1.0000 "application " | TOPICAL_OINTMENT | Freq: Two times a day (BID) | CUTANEOUS | 0 refills | Status: AC

## 2019-01-17 MED ORDER — DOXYCYCLINE HYCLATE 100 MG PO CAPS: 100.0000 mg | ORAL_CAPSULE | Freq: Two times a day (BID) | ORAL | 0 refills | Status: DC

## 2019-01-17 NOTE — ED Triage Notes (Signed)
Pt c/o skin tag irritation on his neck. Started about a week ago. He states he noticed it red then the pain started yesterday. He states that it feels like there is a lump under it when moved his head down.

## 2019-01-17 NOTE — Discharge Instructions (Signed)
Medications as prescribed.  See Derm if desired [facility]).  Take care  Dr. Adriana Simas

## 2019-01-17 NOTE — ED Provider Notes (Signed)
[facility]    CSN: [id number] Arrival date & time: 01/17/19  1102      History   Chief Complaint Chief Complaint  Patient presents with  . skin tag irritation    neck   HPI  39 year old male presents with the above complaint.  Patient reports that he has a raised skin lesion/skin tag on his neck.  He states that it has been irritated, red, and painful since yesterday.  Pain 4/10 in severity. No fever.  No medications or interventions tried.  Exacerbated by touch.  No relieving factors.  No other complaints.  PMH, Surgical Hx, Family Hx, Social History reviewed and updated as below.  PMH: Kidney stone, Gout, Asthma  Past Surgical History:  Procedure Laterality Date  . NO PAST SURGERIES     Home Medications    Prior to Admission medications   Medication Sig Start Date End Date Taking? Authorizing Provider  doxycycline (VIBRAMYCIN) 100 MG capsule Take 1 capsule (100 mg total) by mouth 2 (two) times daily. 01/17/19   Coral Spikes, DO  mupirocin ointment (BACTROBAN) 2 % Apply 1 application topically 2 (two) times daily for 7 days. 01/17/19 01/24/19  Coral Spikes, DO  albuterol (PROVENTIL HFA;VENTOLIN HFA) 108 (90 Base) MCG/ACT inhaler Inhale 1-2 puffs into the lungs every 4 (four) hours as needed for wheezing or shortness of breath. 04/07/17 01/17/19  Wynona Luna, MD    Family History Family History  Family history unknown: Yes    Social History Social History   Tobacco Use  . Smoking status: Never Smoker  . Smokeless tobacco: Never Used  Substance Use Topics  . Alcohol use: Yes    Comment: seldom  . Drug use: No     Allergies   Kiwi extract   Review of Systems Review of Systems  Constitutional: Negative.   Skin:       Skin lesion red, painful.   Physical Exam Triage Vital Signs ED Triage Vitals  Enc Vitals Group     BP 01/17/19 1138 (!) 132/99     Pulse Rate 01/17/19 1138 87     Resp 01/17/19 1138 18     Temp 01/17/19 1138 98.2 F  (36.8 C)     Temp Source 01/17/19 1138 Oral     SpO2 01/17/19 1138 100 %     Weight 01/17/19 1134 (!) 328 lb (148.8 kg)     Height 01/17/19 1134 5\' 10"  (1.778 m)     Head Circumference --      Peak Flow --      Pain Score 01/17/19 1134 4     Pain Loc --      Pain Edu? --      Excl. in [place]? --    Updated Vital Signs BP (!) 132/99 (BP Location: Left Arm)   Pulse 87   Temp 98.2 F (36.8 C) (Oral)   Resp 18   Ht 5\' 10"  (1.778 m)   Wt (!) 148.8 kg   SpO2 100%   BMI 47.06 kg/m   Visual Acuity Right Eye Distance:   Left Eye Distance:   Bilateral Distance:    Right Eye Near:   Left Eye Near:    Bilateral Near:     Physical Exam Constitutional:      General: He is not in acute distress.    Appearance: Normal appearance. He is obese. He is not ill-appearing.  HENT:     Head: Normocephalic and atraumatic.  Eyes:  General:        Right eye: No discharge.        Left eye: No discharge.     Conjunctiva/sclera: Conjunctivae normal.  Neck:      Comments: Small, 0.5 cm raised skin lesion with tenderness to palpation and surrounding erythema. Cardiovascular:     Rate and Rhythm: Normal rate and regular rhythm.     Heart sounds: No murmur.  Pulmonary:     Effort: Pulmonary effort is normal.     Breath sounds: Normal breath sounds. No wheezing, rhonchi or rales.  Neurological:     Mental Status: He is alert.  Psychiatric:        Mood and Affect: Mood normal.        Behavior: Behavior normal.     UC Treatments / Results  Labs (all labs ordered are listed, but only abnormal results are displayed) Labs Reviewed - No data to display  EKG   Radiology No results found.  Procedures Procedures (including critical care time)  Medications Ordered in UC Medications - No data to display  Initial Impression / Assessment and Plan / UC Course  I have reviewed the triage vital signs and the nursing notes.  Pertinent labs & imaging results that were available during my  care of the patient were reviewed by me and considered in my medical decision making (see chart for details).    39 year old male presents with a raised skin lesion which appears to be infected.  Tender to palpation, with surrounding erythema.  Placing on Doxy and Bactroban ointment.  Advised to see dermatology if he desires removal after infection has subsided.  Final Clinical Impressions(s) / UC Diagnoses   Final diagnoses:  Skin infection     Discharge Instructions     Medications as prescribed.  See Derm if desired [facility] dermatology).  Take care  Dr. Adriana Simas   ED Prescriptions    Medication Sig Dispense Auth. Provider   doxycycline (VIBRAMYCIN) 100 MG capsule Take 1 capsule (100 mg total) by mouth 2 (two) times daily. 14 capsule Maxamus Colao G, DO   mupirocin ointment (BACTROBAN) 2 % Apply 1 application topically 2 (two) times daily for 7 days. 22 g Tommie Sams, DO     PDMP not reviewed this encounter.   Tommie Sams, DO 01/17/19 1329

## 2019-01-27 ENCOUNTER — Ambulatory Visit: Payer: Managed Care, Other (non HMO) | Admitting: Adult Health

## 2019-01-27 ENCOUNTER — Encounter: Payer: Self-pay | Admitting: Adult Health

## 2019-01-27 ENCOUNTER — Other Ambulatory Visit: Payer: Self-pay

## 2019-01-27 VITALS — BP 118/88 | HR 89 | Temp 96.2°F | Resp 16 | Ht 69.5 in | Wt 338.8 lb

## 2019-01-27 DIAGNOSIS — Q828 Other specified congenital malformations of skin: Secondary | ICD-10-CM | POA: Diagnosis not present

## 2019-01-27 DIAGNOSIS — Z131 Encounter for screening for diabetes mellitus: Secondary | ICD-10-CM | POA: Diagnosis not present

## 2019-01-27 DIAGNOSIS — Z6841 Body Mass Index (BMI) 40.0 and over, adult: Secondary | ICD-10-CM

## 2019-01-27 DIAGNOSIS — J3489 Other specified disorders of nose and nasal sinuses: Secondary | ICD-10-CM

## 2019-01-27 DIAGNOSIS — Z0000 Encounter for general adult medical examination without abnormal findings: Secondary | ICD-10-CM

## 2019-01-27 DIAGNOSIS — Z23 Encounter for immunization: Secondary | ICD-10-CM

## 2019-01-27 DIAGNOSIS — K429 Umbilical hernia without obstruction or gangrene: Secondary | ICD-10-CM

## 2019-01-27 DIAGNOSIS — R430 Anosmia: Secondary | ICD-10-CM

## 2019-01-27 DIAGNOSIS — E66813 Obesity, class 3: Secondary | ICD-10-CM

## 2019-01-27 DIAGNOSIS — Z113 Encounter for screening for infections with a predominantly sexual mode of transmission: Secondary | ICD-10-CM

## 2019-01-27 DIAGNOSIS — Z13220 Encounter for screening for lipoid disorders: Secondary | ICD-10-CM | POA: Diagnosis not present

## 2019-01-27 DIAGNOSIS — Z1329 Encounter for screening for other suspected endocrine disorder: Secondary | ICD-10-CM

## 2019-01-27 MED ORDER — BACTROBAN NASAL 2 % NA OINT: TOPICAL_OINTMENT | NASAL | 0 refills | Status: DC

## 2019-01-27 NOTE — Patient Instructions (Signed)

## 2019-01-27 NOTE — Progress Notes (Signed)
Patient: Cameron Thompson, Male    DOB: April 28, 1980, 39 y.o.   MRN: [id number] Visit Date: 01/27/2019  Today's Provider: Jairo Ben, FNP   No chief complaint on file.  Subjective:    New Patientnew Cameron Thompson is a 39 y.o. male who presents today for health maintenance and establish care patient was previously seen at [facility]. He feels well. He reports exercising by walking. He reports he is sleeping fairly well on average 4-6hrs.  He works at [employer].  He has some skin tags he is concerns about.He reports the ones on his neck rubs his necklace or collars. He had one under his neck that was infected and treated with antibiotic oral and topical  Resolved with treatments 01/17/2019. Denies any areas concerned today. He would like referral to dermatology for removal.   He reports he has a area around his navel that has been present x 5 years plus. 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 He reports the area is painful at times.   He reports 10-12 years loss of smell and wants to see ENT for this.  Denies any neurologic. He reports he had a sinus infection once and lost smell. Denies any sinus issues now. He is doing Flonase now. He also has seasonal allergies and has tried this as suggested by [facility]. He is on zyrtec.  He denies any falls or changes in vision.  He does occasionally have a pain in his right nostril that sometimes feels like it radiates to his eye.  He says this is very occasional but he has noted it for the last few years.  Patient would also like to discuss a umbilical hernia he is concerned about, he reports he was told he had umbilical hernia at birth, however with obesity and heavy lifting he feels that the area is getting larger.  He denies any significant pain other than with the pain of it rubbing against his clothing and pain with moving.  He denies any rectal bleeding.  He denies any gastrointestinal symptoms.  Bowel movements are regular  without pain with defecation.  He desires to have surgical consult for possible repair.  He has never been seen by gastrointestinal MD he has never been seen by surgeon in the past for this he reports.  Reports he feels well, he is doing any weight loss challenge with supplemental drinks trying to lose weight.  He walks a lot at work but other than that does not exercise.  He also tries to maintain a healthy diet but note that he has room for improvement in that area.  He denies chest pain or shortness of breath with rest or with exertion.  He also request to be tested for STDs, he could have had exposures in the past, however he has no symptoms at this time and just would like to be screened today.   Patient  denies any fever, body aches,chills, rash, chest pain, shortness of breath, nausea, vomiting, or diarrhea.    -----------------------------------------------------------------   Review of Systems  Constitutional: Positive for fatigue (mild ). Negative for activity change, appetite change, chills, diaphoresis, fever and unexpected weight change.  HENT: Positive for rhinorrhea (chronic ) and sinus pressure (right nare at times radiates to right eye ). Negative for congestion, dental problem, drooling, ear discharge, ear pain, facial swelling, hearing loss, mouth sores, nosebleeds, postnasal drip, sinus pain, sneezing, sore throat, tinnitus, trouble swallowing and voice change.   Respiratory: Negative for  apnea, cough, choking, chest tightness, shortness of breath, wheezing and stridor.   Cardiovascular: Negative for chest pain, palpitations and leg swelling.  Gastrointestinal: Negative for abdominal distention, abdominal pain, anal bleeding, blood in stool, constipation, diarrhea, nausea, rectal pain and vomiting.       Umbilical hernia causing pain when touching his close, he reports he thinks has been present since birth but is enlarged with weight gain and age.  He denies any pain.    Endocrine: Negative for polydipsia, polyphagia and polyuria.  Genitourinary: Negative.  Negative for decreased urine volume, difficulty urinating, discharge, dysuria, enuresis, flank pain, frequency, genital sores, hematuria, penile pain, penile swelling, scrotal swelling, testicular pain and urgency.  Musculoskeletal: Negative.   Skin: Negative.        [facility] see note   Allergic/Immunologic: Positive for environmental allergies and food allergies.  Neurological: Negative.   Hematological: Negative for adenopathy. Does not bruise/bleed easily.  Psychiatric/Behavioral: Negative.   All other systems reviewed and are negative.   Social History He  reports that he has never smoked. He has never used smokeless tobacco. He reports current alcohol use. He reports that he does not use drugs. Social History   Socioeconomic History  . Marital status: Single    Spouse name: Not on file  . Number of children: Not on file  . Years of education: Not on file  . Highest education level: Not on file  Occupational History  . Not on file  Tobacco Use  . Smoking status: Never Smoker  . Smokeless tobacco: Never Used  Substance and Sexual Activity  . Alcohol use: Yes    Comment: seldom  . Drug use: No  . Sexual activity: Not on file  Other Topics Concern  . Not on file  Social History Narrative  . Not on file   Social Determinants of Health   Financial Resource Strain:   . Difficulty of Paying Living Expenses: Not on file  Food Insecurity:   . Worried About Programme researcher, broadcasting/film/videounning Out of Food in the Last Year: Not on file  . Ran Out of Food in the Last Year: Not on file  Transportation Needs:   . Lack of Transportation (Medical): Not on file  . Lack of Transportation (Non-Medical): Not on file  Physical Activity:   . Days of Exercise per Week: Not on file  . Minutes of Exercise per Session: Not on file  Stress:   . Feeling of Stress : Not on  file  Social Connections:   . Frequency of Communication with Friends and Family: Not on file  . Frequency of Social Gatherings with Friends and Family: Not on file  . Attends Religious Services: Not on file  . Active Member of Clubs or Organizations: Not on file  . Attends BankerClub or Organization Meetings: Not on file  . Marital Status: Not on file    There are no problems to display for this patient.   Past Surgical History:  Procedure Laterality Date  . NO PAST SURGERIES      Family History  No family status information on file.   His Family history is unknown by patient.     Allergies  Allergen Reactions  . Kiwi Extract Anaphylaxis    Previous Medications   DOXYCYCLINE (VIBRAMYCIN) 100 MG CAPSULE    Take 1 capsule (100 mg total) by mouth 2 (two) times daily.    Patient Care Team: Berniece PapFlinchum, Michelle S,  FNP as PCP - General (Family Medicine)      Objective:   Blood pressure 118/88, pulse 89, temperature (!) 96.2 F (35.7 C), temperature source Oral, resp. rate 16, height 5' 9.5" (1.765 m), weight (!) 338 lb 12.8 oz (153.7 kg), SpO2 93 %. Temporal thermometer used not oral - reads 1 degree lower.  Physical Exam Vitals and nursing note reviewed.  Constitutional:      General: He is not in acute distress.    Appearance: Normal appearance. He is well-developed. He is obese. He is not ill-appearing, toxic-appearing or diaphoretic.     Comments: Patient is alert and oriented and responsive to questions Engages in eye contact with provider. Speaks in full sentences without any pauses without any shortness of breath or distress.    HENT:     Head: Normocephalic and atraumatic.     Jaw: There is normal jaw occlusion.     Salivary Glands: Right salivary gland is not diffusely enlarged or tender. Left salivary gland is not diffusely enlarged or tender.     Right Ear: Hearing, ear canal and external ear normal. A middle ear effusion is present. Tympanic membrane is not  erythematous.     Left Ear: Hearing, ear canal and external ear normal. A middle ear effusion is present. Tympanic membrane is not erythematous.     Nose: Nose normal. No congestion or rhinorrhea.     Right Nostril: No foreign body, epistaxis, septal hematoma or occlusion.     Right Turbinates: Swollen. Not pale.     Left Turbinates: Not pale.     Right Sinus: No maxillary sinus tenderness or frontal sinus tenderness.     Left Sinus: No maxillary sinus tenderness or frontal sinus tenderness.     Comments: Right nare with small pustule along septum, non tender.     Mouth/Throat:     Mouth: Mucous membranes are moist.     Pharynx: Uvula midline. No oropharyngeal exudate.  Eyes:     General: Lids are normal. No scleral icterus.       Right eye: No discharge.        Left eye: No discharge.     Conjunctiva/sclera: Conjunctivae normal.     Pupils: Pupils are equal, round, and reactive to light.  Neck:     Thyroid: No thyromegaly.     Vascular: Normal carotid pulses. No carotid bruit, hepatojugular reflux or JVD.     Trachea: Trachea and phonation normal. No tracheal tenderness or tracheal deviation.     Meningeal: Brudzinski's sign absent.  Cardiovascular:     Rate and Rhythm: Normal rate and regular rhythm.     Pulses: Normal pulses.     Heart sounds: Normal heart sounds, S1 normal and S2 normal. Heart sounds not distant. No murmur. No friction rub. No gallop.   Pulmonary:     Effort: Pulmonary effort is normal. No accessory muscle usage or respiratory distress.     Breath sounds: Normal breath sounds. No stridor. No wheezing, rhonchi or rales.  Chest:     Chest wall: No tenderness.  Abdominal:     General: Bowel sounds are normal. There is no distension.     Palpations: Abdomen is soft. There is no mass.     Tenderness: There is no abdominal tenderness. There is no right CVA tenderness, left CVA tenderness, guarding or rebound. Negative signs include Murphy's sign, Rovsing's sign,  McBurney's sign, psoas sign and obturator sign.     Hernia: A hernia is  present. Hernia is present in the umbilical area.       Comments: Flesh colored, mild pink in areas, raised umbilical hernia approximately 2 cm x 2 cm x 1.5cm in size. Easily reproducible. No pain on exam, mild tenderness with moving hernia, skin moist underneath in that area.   Genitourinary:    Comments: Deferred by patient  no concerns at this time.  Declines rectal concerns/ exam.  Musculoskeletal:        General: No tenderness or deformity. Normal range of motion.     Cervical back: Full passive range of motion without pain, normal range of motion and neck supple.     Comments: Patient moves on and off of exam table and in room without difficulty. Gait is normal in hall and in room. Patient is oriented to person place time and situation. Patient answers questions appropriately and engages in conversation.   Lymphadenopathy:     Head:     Right side of head: No submental, submandibular, tonsillar, preauricular, posterior auricular or occipital adenopathy.     Left side of head: No submental, submandibular, tonsillar, preauricular, posterior auricular or occipital adenopathy.     Cervical: No cervical adenopathy.  Skin:    General: Skin is warm and dry.     Capillary Refill: Capillary refill takes less than 2 seconds.     Coloration: Skin is not pale.     Findings: No erythema or rash.     Nails: There is no clubbing.          Comments: Multiple skin tags varying in size, flesh-colored with stalks,  no abnormality in color, bleeding or discharge.  Recent infected skin tag under neck has no erythema and appears to have healed.  Previously seen in the ER for this.  Neurological:     General: No focal deficit present.     Mental Status: He is alert and oriented to person, place, and time.     GCS: GCS eye subscore is 4. GCS verbal subscore is 5. GCS motor subscore is 6.     Cranial Nerves: Cranial nerves are  intact. No cranial nerve deficit.     Sensory: Sensation is intact. No sensory deficit.     Motor: Motor function is intact. No weakness or abnormal muscle tone.     Coordination: Coordination is intact. Coordination normal.     Gait: Gait is intact. Gait normal.     Deep Tendon Reflexes: Reflexes are normal and symmetric. Reflexes normal.     Reflex Scores:      Tricep reflexes are 2+ on the right side and 2+ on the left side.      Brachioradialis reflexes are 2+ on the right side and 2+ on the left side.      Achilles reflexes are 2+ on the right side and 2+ on the left side. Psychiatric:        Mood and Affect: Mood normal.        Speech: Speech normal.        Behavior: Behavior normal.        Thought Content: Thought content normal.        Judgment: Judgment normal.      Depression Screen [facility]  Decreased Interest 0  Down, Depressed, Hopeless 0  PHQ - 2 Score 0  Altered sleeping 3  Tired, decreased energy 2  Change in appetite 1  Feeling bad or failure about yourself  0  Trouble concentrating 0  Moving slowly or fidgety/restless 0  Suicidal thoughts 0  PHQ-9 Score 6  Difficult doing work/chores Not difficult at all   He denies suicidal or homicidal ideations or intents.   Assessment & Plan:     Routine Health Maintenance and Physical Exam  Exercise Activities and Dietary recommendations Goals   None      There is no immunization history on file for this patient.  Health Maintenance  Topic Date Due  . HIV Screening  11/30/1993  . TETANUS/TDAP  11/30/1997  . INFLUENZA VACCINE  08/13/2018   Cards yearly eye exam, dental exam.  He did have his influenza vaccine today.  Declines Tdap needs today, he is aware of guidelines.  Discussed health benefits of physical activity, and encouraged him to engage in regular exercise appropriate for his age and condition.  [facility] employee number attached to lab orders.  1. Need for  influenza vaccination  - Flu Vaccine QUAD 36+ mos IM (Fluarix & Fluzone Quad PF  2. Class 3 severe obesity due to excess calories without serious comorbidity with body mass index (BMI) of 45.0 to 49.9 in adult [facility]) Encourage weight loss, exercise.  - CBC with Differential/Platelet 005009  3. Accessory skin tags referral to dermatology per patients request for multiple skin tags. Neck skin tag recently infected due to rubbing was treated at dermatology. Will rule out diabetes.   4. Routine health maintenance  - CBC with Differential/Platelet 005009 - Comprehensive Metabolic Panel (CMET) - TSH; Future - Lipid Panel w/o Chol/HDL Ratio - HIV screening - PSA - Ambulatory referral to Dermatology - Ambulatory referral to ENT - RPR - Hepatitis panel, acute 353299 - VITAMIN D 25 Hydroxy (Vit-D Deficiency, Fractures) - HgB A1c  5. 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  Refer to ENT.   6. Screening for STD (sexually transmitted disease)  LABS ORDERED.  - Chlamydia/GC NAA, Confirmation Trichomonas was unable - to perform- needs swab. Urine only was obtained.  Asymptomatic he has no signs or symptoms today. Would like screening.  7. Umbilical hernia without obstruction and without gangrene - Ambulatory referral to General Surgery  8. Nasal sore Meds ordered this encounter  Medications  . mupirocin nasal ointment (BACTROBAN NASAL) 2 %    Sig: Apply small amount to right nostril twice daily as directed.    Dispense:  10 g    Refill:  0    9. Screening for diabetes mellitus A1c/ cmp   Orders Placed This Encounter  Procedures  . Chlamydia/GC NAA, Confirmation  . Flu Vaccine QUAD 36+ mos IM (Fluarix & Fluzone Quad PF  . CBC with Differential/Platelet [id number]  . Comprehensive Metabolic Panel (CMET)  . TSH  . Lipid Panel w/o Chol/HDL Ratio  . HIV screening  . PSA  . RPR  . Hepatitis panel, acute [id number]  . VITAMIN D 25  Hydroxy (Vit-D Deficiency, Fractures)  . HgB A1c  . Ambulatory referral to Dermatology  . Ambulatory referral to ENT  . Ambulatory referral to General Surgery     Advised patient call the office or your primary care doctor for an appointment if no improvement within 72 hours or if any symptoms change or worsen at any time  Advised ER or urgent Care if after hours or on weekend. Call 911 for emergency symptoms at any time.Patinet verbalized understanding of all instructions given/reviewed and treatment plan and has no further questions or concerns at this time.    Return in 1  month (on 02/27/2019), or if symptoms worsen or fail to improve, for Go to Emergency room/ urgent care if worse, at any time for any worsening symptoms.  The entirety of the information documented in the History of Present Illness, Review of Systems and Physical Exam were personally obtained by me. Portions of this information were initially documented by the  Certified Medical Assistant whose name is documented in [place] and reviewed by me for thoroughness and accuracy.  I have personally performed the exam and reviewed the chart and it is accurate to the best of my knowledge.  Haematologist has been used and any errors in dictation or transcription are unintentional.  Kelby Aline. [facility]  --------------------------------------------------------------------

## 2019-01-31 LAB — CHLAMYDIA/GC NAA, CONFIRMATION
Chlamydia trachomatis, NAA: NEGATIVE
Neisseria gonorrhoeae, NAA: NEGATIVE

## 2019-01-31 NOTE — Progress Notes (Signed)
He has not had other labs drawn.  Advise Chlamydia / gonorrhea negative.

## 2019-02-07 ENCOUNTER — Telehealth: Payer: Self-pay | Admitting: Adult Health

## 2019-02-07 ENCOUNTER — Telehealth: Payer: Self-pay

## 2019-02-07 ENCOUNTER — Encounter: Payer: Self-pay | Admitting: Adult Health

## 2019-02-07 LAB — CBC WITH DIFFERENTIAL/PLATELET
Basophils Absolute: 0.1 10*3/uL (ref 0.0–0.2)
Basos: 1 %
EOS (ABSOLUTE): 0.3 10*3/uL (ref 0.0–0.4)
Eos: 3 %
Hematocrit: 46.3 % (ref 37.5–51.0)
Hemoglobin: 15.5 g/dL (ref 13.0–17.7)
Immature Grans (Abs): 0 10*3/uL (ref 0.0–0.1)
Immature Granulocytes: 0 %
Lymphocytes Absolute: 1.9 10*3/uL (ref 0.7–3.1)
Lymphs: 21 %
MCH: 29.5 pg (ref 26.6–33.0)
MCHC: 33.5 g/dL (ref 31.5–35.7)
MCV: 88 fL (ref 79–97)
Monocytes Absolute: 0.6 10*3/uL (ref 0.1–0.9)
Monocytes: 7 %
Neutrophils Absolute: 6.2 10*3/uL (ref 1.4–7.0)
Neutrophils: 68 %
Platelets: 327 10*3/uL (ref 150–450)
RBC: 5.26 x10E6/uL (ref 4.14–5.80)
RDW: 13.3 % (ref 11.6–15.4)
WBC: 9.1 10*3/uL (ref 3.4–10.8)

## 2019-02-07 LAB — RPR: RPR Ser Ql: NONREACTIVE

## 2019-02-07 LAB — COMPREHENSIVE METABOLIC PANEL
ALT: 36 IU/L (ref 0–44)
AST: 21 IU/L (ref 0–40)
Albumin/Globulin Ratio: 1.8 (ref 1.2–2.2)
Albumin: 4.4 g/dL (ref 4.0–5.0)
Alkaline Phosphatase: 91 IU/L (ref 39–117)
BUN/Creatinine Ratio: 15 (ref 9–20)
BUN: 11 mg/dL (ref 6–24)
Bilirubin Total: 0.3 mg/dL (ref 0.0–1.2)
CO2: 24 mmol/L (ref 20–29)
Calcium: 9.7 mg/dL (ref 8.7–10.2)
Chloride: 99 mmol/L (ref 96–106)
Creatinine, Ser: 0.74 mg/dL — ABNORMAL LOW (ref 0.76–1.27)
GFR calc Af Amer: 133 mL/min/{1.73_m2} (ref >59)
GFR calc non Af Amer: 115 mL/min/{1.73_m2} (ref >59)
Globulin, Total: 2.5 g/dL (ref 1.5–4.5)
Glucose: 81 mg/dL (ref 65–99)
Potassium: 4.3 mmol/L (ref 3.5–5.2)
Sodium: 138 mmol/L (ref 134–144)
Total Protein: 6.9 g/dL (ref 6.0–8.5)

## 2019-02-07 LAB — LIPID PANEL W/O CHOL/HDL RATIO
Cholesterol, Total: 195 mg/dL (ref 100–199)
HDL: 34 mg/dL — ABNORMAL LOW (ref >39)
LDL Chol Calc (NIH): 108 mg/dL — ABNORMAL HIGH (ref 0–99)
Triglycerides: 308 mg/dL — ABNORMAL HIGH (ref 0–149)
VLDL Cholesterol Cal: 53 mg/dL — ABNORMAL HIGH (ref 5–40)

## 2019-02-07 LAB — HEPATITIS PANEL, ACUTE
Hep A IgM: NEGATIVE
Hep B C IgM: NEGATIVE
Hep C Virus Ab: <0.1 s/co ratio (ref 0.0–0.9)
Hepatitis B Surface Ag: NEGATIVE

## 2019-02-07 LAB — PSA: Prostate Specific Ag, Serum: 0.7 ng/mL (ref 0.0–4.0)

## 2019-02-07 LAB — HEMOGLOBIN A1C
Est. average glucose Bld gHb Est-mCnc: 114 mg/dL
Hgb A1c MFr Bld: 5.6 % (ref 4.8–5.6)

## 2019-02-07 LAB — HIV ANTIBODY (ROUTINE TESTING W REFLEX): HIV Screen 4th Generation wRfx: NONREACTIVE

## 2019-02-07 LAB — VITAMIN D 25 HYDROXY (VIT D DEFICIENCY, FRACTURES): Vit D, 25-Hydroxy: 10.4 ng/mL — ABNORMAL LOW (ref 30.0–100.0)

## 2019-02-07 MED ORDER — VITAMIN D (ERGOCALCIFEROL) 1.25 MG (50000 UNIT) PO CAPS: 50000.0000 [IU] | ORAL_CAPSULE | ORAL | 0 refills | Status: DC

## 2019-02-07 NOTE — Progress Notes (Signed)
[id number] 

## 2019-02-07 NOTE — Telephone Encounter (Signed)
So I contacted [facility] and the TSH is still in chart marked as a future order and was not released. I requested for them to add TSHto labs since he had them drawn yesterday. I will reach out to patient and let him know about rest of labs. KW

## 2019-02-07 NOTE — Telephone Encounter (Signed)
-----   Message from Berniece Pap, FNP sent at 02/07/2019  8:28 AM EST ----- Nicholos Johns can you check with [facility] to see about TSH its not showing?  CBC is normal no signs of anemia or infection.  CMP is within normal range, kidney, liver function is within normal range.  HIV non reactive/ negative.  PSA is normal for prostate.  RPR for syphilis is negative.  Hepatitis A, B, and C are negative.  Hemoglobin A1C is within normal range 5.6, do monitor glucose food intake/ avoids sugary drinks so that you do not go into pre diabetic range.  Vitamin D is deficiency 10.4 low, normal is 30-100. Sending to pharmacy: Vitamin D take 50,000 IU (international units ) one tablet by mouth every seven days (once weekly) for 8 weeks. Then switch to Vitamin D 3 over the counter at 4,000 IU daily by mouth. Recheck level in 3-4 months.

## 2019-02-07 NOTE — Telephone Encounter (Signed)
Pt given result per Marvell Fuller; he verbalized understanding.

## 2019-02-07 NOTE — Progress Notes (Signed)
Cameron Thompson can you check with [facility] to see about TSH its not showing?  CBC is normal no signs of anemia or infection.  CMP is within normal range, kidney, liver function is within normal range.  HIV non reactive/ negative.  PSA is normal for prostate.  RPR for syphilis is negative.  Hepatitis A, B, and C are negative.  Hemoglobin A1C is within normal range 5.6, do monitor glucose food intake/ avoids sugary drinks so that you do not go into pre diabetic range.  Vitamin D is deficiency 10.4 low, normal is 30-100. Sending to pharmacy: Vitamin D take 50,000 IU (international units ) one tablet by mouth every seven days (once weekly) for 8 weeks. Then switch to Vitamin D 3 over the counter at 4,000 IU daily by mouth. Recheck level in 3-4 months.

## 2019-02-07 NOTE — Telephone Encounter (Signed)
Meds ordered this encounter  Medications  . Vitamin D, Ergocalciferol, (DRISDOL) 1.25 MG (50000 UNIT) CAPS capsule    Sig: Take 1 capsule (50,000 Units total) by mouth every 7 (seven) days.    Dispense:  8 capsule    Refill:  0   

## 2019-02-07 NOTE — Telephone Encounter (Signed)
Left message for patient to call office back, okay for PEC  Triage to advise patient. KW

## 2019-02-08 NOTE — Progress Notes (Signed)
Thyroid level is normal

## 2019-02-15 ENCOUNTER — Other Ambulatory Visit: Payer: Self-pay | Admitting: Otolaryngology

## 2019-02-15 ENCOUNTER — Other Ambulatory Visit: Payer: Self-pay | Admitting: General Surgery

## 2019-02-15 DIAGNOSIS — R430 Anosmia: Secondary | ICD-10-CM

## 2019-02-15 LAB — TSH: TSH: 1.16 u[IU]/mL (ref 0.450–4.500)

## 2019-02-15 LAB — SPECIMEN STATUS REPORT

## 2019-02-24 ENCOUNTER — Encounter
Admission: RE | Admit: 2019-02-24 | Discharge: 2019-02-24 | Disposition: A | Discharge status: Home / Self Care | Payer: Managed Care, Other (non HMO) | Source: Ambulatory Visit | Attending: General Surgery | Admitting: General Surgery

## 2019-02-24 ENCOUNTER — Other Ambulatory Visit: Payer: Self-pay

## 2019-02-24 HISTORY — DX: Personal history of urinary calculi: Z87.442

## 2019-02-24 NOTE — Patient Instructions (Signed)
Your procedure is scheduled on: Friday 03/03/19.  Report to DAY SURGERY DEPARTMENT LOCATED ON 2ND FLOOR MEDICAL MALL ENTRANCE. To find out your arrival time please call [phone] between 1PM - 3PM on Thursday 03/02/19.   Remember: Instructions that are not followed completely may result in serious medical risk, up to and including death, or upon the discretion of your surgeon and anesthesiologist your surgery may need to be rescheduled.      _X__ 1. Do not eat food after midnight the night before your procedure.                 No gum chewing or hard candies. You may drink clear liquids up to 2 hours                 before you are scheduled to arrive for your surgery- DO NOT drink clear                 liquids within 2 hours of the start of your surgery.                 Clear Liquids include:  water, apple juice without pulp, clear carbohydrate                 drink such as Clearfast or Gatorade, Black Coffee or Tea (Do not add                 anything to coffee or tea).    __X__2.  On the morning of surgery brush your teeth with toothpaste and water, you may rinse your mouth with mouthwash if you wish.  Do not swallow any toothpaste or mouthwash.      _X__ 3.  No Alcohol for 24 hours before or after surgery.    _X__ 4.  Do Not Smoke or use e-cigarettes For 24 Hours Prior to Your Surgery.                 Do not use any chewable tobacco products for at least 6 hours prior to                 Surgery.   __X__5.  Notify your doctor if there is any change in your medical condition      (cold, fever, infections).       Do not wear jewelry, make-up, hairpins, clips or nail polish. Do not wear lotions, powders, or perfumes.  Do not shave 48 hours prior to surgery. Men may shave face and neck. Do not bring valuables to the hospital.     [facility] is not responsible for any belongings or valuables.   Contacts, dentures/partials or body piercings may not be worn into surgery.  Bring a case for your contacts, glasses or hearing aids, a denture cup will be supplied.    Patients discharged the day of surgery will not be allowed to drive home.     __X__ Take these medicines the morning of surgery with A SIP OF WATER:     1. Inhaler     __X__ Use CHG Soap as directed   _ X___ Use inhalers on the day of surgery.     __X__ Stop Anti-inflammatories 7 days before surgery such as Advil, Ibuprofen, Motrin, BC or Goodies Powder, Naprosyn, Naproxen, Aleve, Aspirin, Meloxicam. May take Tylenol if needed for pain or discomfort.    __X__ Don't start taking any new herbal supplements before your procedure.

## 2019-02-27 ENCOUNTER — Ambulatory Visit: Payer: Self-pay | Admitting: Adult Health

## 2019-02-28 ENCOUNTER — Ambulatory Visit: Payer: Managed Care, Other (non HMO)

## 2019-03-01 ENCOUNTER — Other Ambulatory Visit: Payer: Self-pay

## 2019-03-01 ENCOUNTER — Other Ambulatory Visit
Admission: RE | Admit: 2019-03-01 | Discharge: 2019-03-01 | Disposition: A | Discharge status: Home / Self Care | Payer: Managed Care, Other (non HMO) | Source: Ambulatory Visit | Attending: General Surgery | Admitting: General Surgery

## 2019-03-01 DIAGNOSIS — Z20822 Contact with and (suspected) exposure to covid-19: Secondary | ICD-10-CM | POA: Insufficient documentation

## 2019-03-01 DIAGNOSIS — Z01812 Encounter for preprocedural laboratory examination: Secondary | ICD-10-CM | POA: Insufficient documentation

## 2019-03-01 LAB — SARS CORONAVIRUS 2 (TAT 6-24 HRS): SARS Coronavirus 2: NEGATIVE

## 2019-03-02 ENCOUNTER — Ambulatory Visit: Payer: Self-pay | Admitting: Adult Health

## 2019-03-03 ENCOUNTER — Other Ambulatory Visit: Payer: Self-pay

## 2019-03-03 ENCOUNTER — Encounter
Admission: RE | Disposition: A | Discharge status: Home / Self Care | Payer: Self-pay | Source: Home / Self Care | Attending: General Surgery

## 2019-03-03 ENCOUNTER — Ambulatory Visit: Payer: Managed Care, Other (non HMO) | Admitting: Anesthesiology

## 2019-03-03 ENCOUNTER — Encounter: Payer: Self-pay | Admitting: General Surgery

## 2019-03-03 ENCOUNTER — Ambulatory Visit
Admission: RE | Admit: 2019-03-03 | Discharge: 2019-03-03 | Disposition: A | Discharge status: Home / Self Care | Payer: Managed Care, Other (non HMO) | Attending: General Surgery | Admitting: General Surgery

## 2019-03-03 DIAGNOSIS — Z87442 Personal history of urinary calculi: Secondary | ICD-10-CM | POA: Diagnosis not present

## 2019-03-03 DIAGNOSIS — K429 Umbilical hernia without obstruction or gangrene: Secondary | ICD-10-CM | POA: Insufficient documentation

## 2019-03-03 DIAGNOSIS — J45909 Unspecified asthma, uncomplicated: Secondary | ICD-10-CM | POA: Diagnosis not present

## 2019-03-03 DIAGNOSIS — Z6841 Body Mass Index (BMI) 40.0 and over, adult: Secondary | ICD-10-CM | POA: Diagnosis not present

## 2019-03-03 DIAGNOSIS — M109 Gout, unspecified: Secondary | ICD-10-CM | POA: Insufficient documentation

## 2019-03-03 HISTORY — PX: VENTRAL HERNIA REPAIR: SHX424

## 2019-03-03 SURGERY — REPAIR, HERNIA, VENTRAL
Anesthesia: General

## 2019-03-03 MED ORDER — OXYCODONE HCL 5 MG/5ML PO SOLN: 5.0000 mg | Freq: Once | ORAL | Status: DC | PRN

## 2019-03-03 MED ORDER — DEXMEDETOMIDINE HCL IN NACL 200 MCG/50ML IV SOLN
INTRAVENOUS | Status: DC | PRN
  Administered 2019-03-03: 4 ug via INTRAVENOUS
  Administered 2019-03-03: 8 ug via INTRAVENOUS

## 2019-03-03 MED ORDER — ONDANSETRON HCL 4 MG/2ML IJ SOLN
INTRAMUSCULAR | Status: AC
  Filled 2019-03-03: qty 4

## 2019-03-03 MED ORDER — SUGAMMADEX SODIUM 500 MG/5ML IV SOLN
INTRAVENOUS | Status: AC
  Filled 2019-03-03: qty 5

## 2019-03-03 MED ORDER — CHLORHEXIDINE GLUCONATE CLOTH 2 % EX PADS: 6.0000 | MEDICATED_PAD | Freq: Once | CUTANEOUS | Status: DC

## 2019-03-03 MED ORDER — EPHEDRINE SULFATE 50 MG/ML IJ SOLN
INTRAMUSCULAR | Status: AC
  Filled 2019-03-03: qty 1

## 2019-03-03 MED ORDER — LACTATED RINGERS IV SOLN: INTRAVENOUS | Status: DC

## 2019-03-03 MED ORDER — PROPOFOL 10 MG/ML IV BOLUS
INTRAVENOUS | Status: AC
  Filled 2019-03-03: qty 20

## 2019-03-03 MED ORDER — LIDOCAINE HCL (CARDIAC) PF 100 MG/5ML IV SOSY
PREFILLED_SYRINGE | INTRAVENOUS | Status: DC | PRN
  Administered 2019-03-03: 100 mg via INTRAVENOUS

## 2019-03-03 MED ORDER — PROPOFOL 10 MG/ML IV BOLUS
INTRAVENOUS | Status: DC | PRN
  Administered 2019-03-03: 200 mg via INTRAVENOUS

## 2019-03-03 MED ORDER — CEFAZOLIN SODIUM-DEXTROSE 2-4 GM/100ML-% IV SOLN
2.0000 g | INTRAVENOUS | Status: AC
  Administered 2019-03-03: 2 g via INTRAVENOUS

## 2019-03-03 MED ORDER — BUPIVACAINE-EPINEPHRINE (PF) 0.5% -1:200000 IJ SOLN
INTRAMUSCULAR | Status: DC | PRN
  Administered 2019-03-03: 30 mL

## 2019-03-03 MED ORDER — FENTANYL CITRATE (PF) 100 MCG/2ML IJ SOLN
INTRAMUSCULAR | Status: DC | PRN
  Administered 2019-03-03: 100 ug via INTRAVENOUS

## 2019-03-03 MED ORDER — CEFAZOLIN SODIUM-DEXTROSE 2-4 GM/100ML-% IV SOLN
INTRAVENOUS | Status: AC
  Filled 2019-03-03: qty 100

## 2019-03-03 MED ORDER — DEXAMETHASONE SODIUM PHOSPHATE 10 MG/ML IJ SOLN
INTRAMUSCULAR | Status: DC | PRN
  Administered 2019-03-03: 10 mg via INTRAVENOUS

## 2019-03-03 MED ORDER — FENTANYL CITRATE (PF) 100 MCG/2ML IJ SOLN
INTRAMUSCULAR | Status: AC
  Filled 2019-03-03: qty 2

## 2019-03-03 MED ORDER — ONDANSETRON HCL 4 MG/2ML IJ SOLN
INTRAMUSCULAR | Status: DC | PRN
  Administered 2019-03-03 (×2): 4 mg via INTRAVENOUS

## 2019-03-03 MED ORDER — MIDAZOLAM HCL 2 MG/2ML IJ SOLN
INTRAMUSCULAR | Status: AC
  Filled 2019-03-03: qty 2

## 2019-03-03 MED ORDER — PHENYLEPHRINE HCL (PRESSORS) 10 MG/ML IV SOLN
INTRAVENOUS | Status: AC
  Filled 2019-03-03: qty 1

## 2019-03-03 MED ORDER — LIDOCAINE HCL (PF) 2 % IJ SOLN
INTRAMUSCULAR | Status: AC
  Filled 2019-03-03: qty 15

## 2019-03-03 MED ORDER — GLYCOPYRROLATE 0.2 MG/ML IJ SOLN
INTRAMUSCULAR | Status: AC
  Filled 2019-03-03: qty 1

## 2019-03-03 MED ORDER — GLYCOPYRROLATE 0.2 MG/ML IJ SOLN
INTRAMUSCULAR | Status: DC | PRN
  Administered 2019-03-03: .2 mg via INTRAVENOUS

## 2019-03-03 MED ORDER — OXYCODONE HCL 5 MG PO TABS: 5.0000 mg | ORAL_TABLET | Freq: Once | ORAL | Status: DC | PRN

## 2019-03-03 MED ORDER — MIDAZOLAM HCL 2 MG/2ML IJ SOLN
INTRAMUSCULAR | Status: DC | PRN
  Administered 2019-03-03: 2 mg via INTRAVENOUS

## 2019-03-03 MED ORDER — DEXMEDETOMIDINE HCL IN NACL 80 MCG/20ML IV SOLN
INTRAVENOUS | Status: AC
  Filled 2019-03-03: qty 20

## 2019-03-03 MED ORDER — ROCURONIUM BROMIDE 50 MG/5ML IV SOLN
INTRAVENOUS | Status: AC
  Filled 2019-03-03: qty 3

## 2019-03-03 MED ORDER — SEVOFLURANE IN SOLN
RESPIRATORY_TRACT | Status: AC
  Filled 2019-03-03: qty 250

## 2019-03-03 MED ORDER — ACETAMINOPHEN 10 MG/ML IV SOLN
INTRAVENOUS | Status: AC
  Filled 2019-03-03: qty 100

## 2019-03-03 MED ORDER — SUCCINYLCHOLINE CHLORIDE 20 MG/ML IJ SOLN
INTRAMUSCULAR | Status: DC | PRN
  Administered 2019-03-03: 200 mg via INTRAVENOUS

## 2019-03-03 MED ORDER — SUGAMMADEX SODIUM 500 MG/5ML IV SOLN
INTRAVENOUS | Status: DC | PRN
  Administered 2019-03-03: 500 mg via INTRAVENOUS

## 2019-03-03 MED ORDER — HYDROCODONE-ACETAMINOPHEN 5-325 MG PO TABS: 1.0000 | ORAL_TABLET | ORAL | 0 refills | Status: DC | PRN

## 2019-03-03 MED ORDER — FAMOTIDINE 20 MG PO TABS
ORAL_TABLET | ORAL | Status: AC
  Administered 2019-03-03: 07:00:00 20 mg via ORAL
  Filled 2019-03-03: qty 1

## 2019-03-03 MED ORDER — FENTANYL CITRATE (PF) 100 MCG/2ML IJ SOLN
25.0000 ug | INTRAMUSCULAR | Status: DC | PRN
  Administered 2019-03-03 (×3): 25 ug via INTRAVENOUS

## 2019-03-03 MED ORDER — BUPIVACAINE-EPINEPHRINE (PF) 0.5% -1:200000 IJ SOLN
INTRAMUSCULAR | Status: AC
  Filled 2019-03-03: qty 30

## 2019-03-03 MED ORDER — FAMOTIDINE 20 MG PO TABS: 20.0000 mg | ORAL_TABLET | Freq: Once | ORAL | Status: AC

## 2019-03-03 MED ORDER — ROCURONIUM BROMIDE 100 MG/10ML IV SOLN
INTRAVENOUS | Status: DC | PRN
  Administered 2019-03-03: 5 mg via INTRAVENOUS
  Administered 2019-03-03: 10 mg via INTRAVENOUS
  Administered 2019-03-03: 50 mg via INTRAVENOUS
  Administered 2019-03-03 (×2): 10 mg via INTRAVENOUS

## 2019-03-03 MED ORDER — SUCCINYLCHOLINE CHLORIDE 20 MG/ML IJ SOLN
INTRAMUSCULAR | Status: AC
  Filled 2019-03-03: qty 2

## 2019-03-03 MED ORDER — ACETAMINOPHEN 10 MG/ML IV SOLN
INTRAVENOUS | Status: DC | PRN
  Administered 2019-03-03: 1000 mg via INTRAVENOUS

## 2019-03-03 MED ORDER — PROPOFOL 10 MG/ML IV BOLUS
INTRAVENOUS | Status: AC
  Filled 2019-03-03: qty 40

## 2019-03-03 SURGICAL SUPPLY — 39 items
BLADE SURG 15 STRL SS SAFETY (BLADE) ×6 IMPLANT
CANISTER SUCT 1200ML W/VALVE (MISCELLANEOUS) ×3 IMPLANT
CHLORAPREP W/TINT 26 (MISCELLANEOUS) ×3 IMPLANT
CLOSURE WOUND 1/2 X4 (GAUZE/BANDAGES/DRESSINGS) ×1
COVER WAND RF STERILE (DRAPES) ×3 IMPLANT
DRAIN CHANNEL JP 15F RND 16 (MISCELLANEOUS) IMPLANT
DRAPE CHEST BREAST 77X106 FENE (MISCELLANEOUS) ×3 IMPLANT
DRAPE LAPAROTOMY 100X77 ABD (DRAPES) ×3 IMPLANT
DRSG OPSITE POSTOP 3X4 (GAUZE/BANDAGES/DRESSINGS) ×3 IMPLANT
DRSG TEGADERM 4X4.75 (GAUZE/BANDAGES/DRESSINGS) IMPLANT
DRSG TELFA 3X8 NADH (GAUZE/BANDAGES/DRESSINGS) IMPLANT
ELECT CAUTERY BLADE 6.4 (BLADE) ×3 IMPLANT
ELECT REM PT RETURN 9FT ADLT (ELECTROSURGICAL) ×3
ELECTRODE REM PT RTRN 9FT ADLT (ELECTROSURGICAL) ×1 IMPLANT
GAUZE SPONGE 4X4 12PLY STRL (GAUZE/BANDAGES/DRESSINGS) IMPLANT
GLOVE BIO SURGEON STRL SZ7.5 (GLOVE) ×6 IMPLANT
GLOVE INDICATOR 8.0 STRL GRN (GLOVE) ×3 IMPLANT
GOWN STRL REUS W/ TWL LRG LVL3 (GOWN DISPOSABLE) ×2 IMPLANT
GOWN STRL REUS W/TWL LRG LVL3 (GOWN DISPOSABLE) ×4
KIT TURNOVER KIT A (KITS) ×3 IMPLANT
LABEL OR SOLS (LABEL) ×3 IMPLANT
MESH VENTRALEX ST 2.5 CRC MED (Mesh General) ×3 IMPLANT
NEEDLE HYPO 22GX1.5 SAFETY (NEEDLE) ×3 IMPLANT
NEEDLE HYPO 25X1 1.5 SAFETY (NEEDLE) IMPLANT
NS IRRIG 500ML POUR BTL (IV SOLUTION) ×3 IMPLANT
PACK BASIN MINOR ARMC (MISCELLANEOUS) ×3 IMPLANT
SPONGE LAP 18X18 RF (DISPOSABLE) ×3 IMPLANT
STAPLER SKIN PROX 35W (STAPLE) IMPLANT
STRIP CLOSURE SKIN 1/2X4 (GAUZE/BANDAGES/DRESSINGS) ×2 IMPLANT
SUT SURGILON 0 BLK (SUTURE) ×6 IMPLANT
SUT VIC AB 2-0 BRD 54 (SUTURE) IMPLANT
SUT VIC AB 2-0 CT1 (SUTURE) ×3 IMPLANT
SUT VIC AB 3-0 SH 27 (SUTURE) ×2
SUT VIC AB 3-0 SH 27X BRD (SUTURE) ×1 IMPLANT
SUT VIC AB 4-0 FS2 27 (SUTURE) ×3 IMPLANT
SUT VICRYL+ 3-0 144IN (SUTURE) ×6 IMPLANT
SWABSTK COMLB BENZOIN TINCTURE (MISCELLANEOUS) ×3 IMPLANT
SYR 10ML LL (SYRINGE) ×3 IMPLANT
SYR 3ML LL SCALE MARK (SYRINGE) ×3 IMPLANT

## 2019-03-03 NOTE — Anesthesia Preprocedure Evaluation (Signed)
Anesthesia Evaluation  Patient identified by MRN, date of birth, ID band Patient awake    Reviewed: Allergy & Precautions, H&P , NPO status , Patient's Chart, lab work & pertinent test results  History of Anesthesia Complications Negative for: history of anesthetic complications  Airway Mallampati: II  TM Distance: >3 FB Neck ROM: full    Dental  (+) Chipped   Pulmonary neg shortness of breath, asthma ,           Cardiovascular Exercise Tolerance: Good (-) angina(-) Past MI and (-) DOE negative cardio ROS       Neuro/Psych negative neurological ROS  negative psych ROS   GI/Hepatic negative GI ROS, Neg liver ROS, neg GERD  ,  Endo/Other  Morbid obesity  Renal/GU      Musculoskeletal   Abdominal   Peds  Hematology negative hematology ROS (+)   Anesthesia Other Findings Past Medical History: No date: Allergy No date: Asthma     Comment:  pollen season No date: Gout No date: History of kidney stones  Past Surgical History: No date: NO PAST SURGERIES No date: NO PAST SURGERIES  BMI    Body Mass Index: 48.29 kg/m      Reproductive/Obstetrics negative OB ROS                             Anesthesia Physical Anesthesia Plan  ASA: III  Anesthesia Plan: General ETT   Post-op Pain Management:    Induction: Intravenous  PONV Risk Score and Plan: Ondansetron, Dexamethasone, Midazolam and Treatment may vary due to age or medical condition  Airway Management Planned: Oral ETT and Video Laryngoscope Planned  Additional Equipment:   Intra-op Plan:   Post-operative Plan: Extubation in OR  Informed Consent: I have reviewed the patients History and Physical, chart, labs and discussed the procedure including the risks, benefits and alternatives for the proposed anesthesia with the patient or authorized representative who has indicated his/her understanding and acceptance.      Dental Advisory Given  Plan Discussed with: Anesthesiologist, CRNA and Surgeon  Anesthesia Plan Comments: (Patient consented for risks of anesthesia including but not limited to:  - adverse reactions to medications - damage to teeth, lips or other oral mucosa - sore throat or hoarseness - Damage to heart, brain, lungs or loss of life  Patient voiced understanding.)        Anesthesia Quick Evaluation

## 2019-03-03 NOTE — Anesthesia Procedure Notes (Signed)
Procedure Name: Intubation Performed by: Mohammed Kindle, CRNA Pre-anesthesia Checklist: Patient identified, Emergency Drugs available, Suction available and Patient being monitored Patient Re-evaluated:Patient Re-evaluated prior to induction Oxygen Delivery Method: Circle system utilized Preoxygenation: Pre-oxygenation with 100% oxygen Induction Type: IV induction and Rapid sequence Ventilation: Mask ventilation without difficulty Laryngoscope Size: McGraph and 3 Grade View: Grade I Tube type: Oral Tube size: 7.5 mm Number of attempts: 1 Airway Equipment and Method: Patient positioned with wedge pillow,  Stylet and Video-laryngoscopy Placement Confirmation: ETT inserted through vocal cords under direct vision,  positive ETCO2,  CO2 detector and breath sounds checked- equal and bilateral Secured at: 21 cm Tube secured with: Tape Dental Injury: Teeth and Oropharynx as per pre-operative assessment

## 2019-03-03 NOTE — H&P (Signed)
Cameron Thompson [id number] [birth year]     HPI:  39 year old male with umbilical and ventral herna. For repair.   Medications Prior to Admission  Medication Sig Dispense Refill Last Dose  . mupirocin ointment (BACTROBAN) 2 % Apply 1 application topically 3 (three) times daily.     . Vitamin D, Ergocalciferol, (DRISDOL) 1.25 MG (50000 UNIT) CAPS capsule Take 1 capsule (50,000 Units total) by mouth every 7 (seven) days. 8 capsule 0 Past Month at Unknown time  . mupirocin nasal ointment (BACTROBAN NASAL) 2 % Apply small amount to right nostril twice daily as directed. (Patient not taking: Reported on 03/03/2019) 10 g 0 Completed Course at Unknown time   Allergies  Allergen Reactions  . Kiwi Extract Anaphylaxis   Past Medical History:  Diagnosis Date  . Allergy   . Asthma    pollen season  . Gout   . History of kidney stones    Past Surgical History:  Procedure Laterality Date  . NO PAST SURGERIES    . NO PAST SURGERIES     Social History   Socioeconomic History  . Marital status: Single    Spouse name: Not on file  . Number of children: Not on file  . Years of education: Not on file  . Highest education level: Not on file  Occupational History  . Not on file  Tobacco Use  . Smoking status: Never Smoker  . Smokeless tobacco: Never Used  Substance and Sexual Activity  . Alcohol use: Yes    Comment: occasionally  . Drug use: No  . Sexual activity: Not on file  Other Topics Concern  . Not on file  Social History Narrative  . Not on file   Social Determinants of Health   Financial Resource Strain:   . Difficulty of Paying Living Expenses: Not on file  Food Insecurity:   . Worried About Programme researcher, broadcasting/film/video in the Last Year: Not on file  . Ran Out of Food in the Last Year: Not on file  Transportation Needs:   . Lack of Transportation (Medical): Not on file  . Lack of Transportation (Non-Medical): Not on file  Physical Activity:   . Days of Exercise per Week: Not  on file  . Minutes of Exercise per Session: Not on file  Stress:   . Feeling of Stress : Not on file  Social Connections:   . Frequency of Communication with Friends and Family: Not on file  . Frequency of Social Gatherings with Friends and Family: Not on file  . Attends Religious Services: Not on file  . Active Member of Clubs or Organizations: Not on file  . Attends Banker Meetings: Not on file  . Marital Status: Not on file  Intimate Partner Violence:   . Fear of Current or Ex-Partner: Not on file  . Emotionally Abused: Not on file  . Physically Abused: Not on file  . Sexually Abused: Not on file   Social History   Social History Narrative  . Not on file     ROS: Negative.     PE: HEENT: Negative. Lungs: Clear. Cardio: RR.   Assessment/Plan:  Proceed with planned ventral hernia repair.    Merrily Pew [facility] 03/03/2019

## 2019-03-03 NOTE — Anesthesia Postprocedure Evaluation (Signed)
Anesthesia Post Note  Patient: Cameron Thompson  Procedure(s) Performed: HERNIA REPAIR UMBILICAL ADULT (N/A )  Patient location during evaluation: PACU Anesthesia Type: General Level of consciousness: awake and alert Pain management: pain level controlled Vital Signs Assessment: post-procedure vital signs reviewed and stable Respiratory status: spontaneous breathing, nonlabored ventilation, respiratory function stable and patient connected to nasal cannula oxygen Cardiovascular status: blood pressure returned to baseline and stable Postop Assessment: no apparent nausea or vomiting Anesthetic complications: no     Last Vitals:  Vitals:   03/03/19 1100 03/03/19 1121  BP: (!) 103/57 117/69  Pulse: 82 81  Resp: 18 16  Temp:    SpO2: 94% 94%    Last Pain:  Vitals:   03/03/19 1121  TempSrc:   PainSc: 5                  Cleda Mccreedy Abbegail Matuska

## 2019-03-03 NOTE — OR Nursing (Signed)
Dr. Lemar Livings in to see patient in postop 1056 am.

## 2019-03-03 NOTE — Discharge Instructions (Signed)

## 2019-03-03 NOTE — Transfer of Care (Signed)
Immediate Anesthesia Transfer of Care Note  Patient: Cameron Thompson  Procedure(s) Performed: HERNIA REPAIR UMBILICAL ADULT (N/A )  Patient Location: PACU  Anesthesia Type:General  Level of Consciousness: drowsy, patient cooperative and responds to stimulation  Airway & Oxygen Therapy: Patient Spontanous Breathing and Patient connected to face mask oxygen  Post-op Assessment: Report given to RN and Post -op Vital signs reviewed and stable  Post vital signs: Reviewed and stable  Last Vitals:  Vitals Value Taken Time  BP 123/69 03/03/19 0909  Temp 36.1 C 03/03/19 0905  Pulse 95 03/03/19 0916  Resp 17 03/03/19 0916  SpO2 95 % 03/03/19 0916  Vitals shown include unvalidated device data.  Last Pain:  Vitals:   03/03/19 0619  TempSrc: Tympanic  PainSc: 0-No pain         Complications: No apparent anesthesia complications

## 2019-03-03 NOTE — OR Nursing (Signed)
Discharge pending family arrival for transportation.

## 2019-03-03 NOTE — Op Note (Signed)
Preoperative diagnosis: Complex umbilical hernia with multiple defects.  Postoperative diagnosis: Same.  Operative procedure: Umbilical hernia repair with secure strap 6.4 cm intraperitoneal mesh.  Operating surgeon: Donnalee Curry, MD.  Anesthesia: General endotracheal, Marcaine 0.5% with 1: 200,000 units of epinephrine, 30 cc.  Clinical note: This 39 year old male has a longstanding umbilical hernia.  This become increasingly problematic with skin irritation due to the size of the hernia contents.  He is admitted for elective repair.  He received Ancef prior to the procedure.  Hair was removed and the surgical site with clippers.  SCD stockings for DVT prevention.  Operative note.  The abdomen was cleansed with ChloraPrep and draped.  The redundant 2-1/2 cm wide 3 cm long (transverse) area of skin covering the main hernia mass was sharply excised and discarded.  The multilobulated hernia sac was then freed circumferentially from the fascia.  The sac was excised and discarded.  Hemostasis of the incarcerated omentum was controlled with 3-0 Vicryl ties.  The undersurface of the fascia and peritoneum was cleared circumferentially.  A 6.4 cm Ventralex mesh was then placed.  This was anchored circumferentially with 0 Surgilon sutures to the posterior rectus sheath and the ring of the mesh.  The midline fascia was then approximated transversely with interrupted 0 Surgilon simple sutures grabbing the mesh with each bite.  The deep adipose layer was then approximated with interrupted 2-0 Vicryl figure-of-eight sutures.  The superficial adipose layer was treated in a similar fashion.  An additional 5 mm wedge of skin was excised on the right inferior lateral aspect for better cosmesis.  The skin was approximated with interrupted 4-0 Vicryl subcuticular sutures.  Benzoin and Steri-Strips followed by a honeycomb dressing was applied.  The patient tolerated the procedure well and was taken recovery in stable  condition.

## 2019-03-14 ENCOUNTER — Encounter: Payer: Self-pay | Admitting: Adult Health

## 2019-03-14 DIAGNOSIS — E559 Vitamin D deficiency, unspecified: Secondary | ICD-10-CM | POA: Insufficient documentation

## 2019-03-21 ENCOUNTER — Other Ambulatory Visit: Payer: Self-pay

## 2019-03-21 ENCOUNTER — Ambulatory Visit: Payer: Managed Care, Other (non HMO) | Admitting: Adult Health

## 2019-03-21 ENCOUNTER — Encounter: Payer: Self-pay | Admitting: Adult Health

## 2019-03-21 VITALS — BP 118/82 | HR 87 | Temp 97.4°F | Resp 16 | Wt 340.9 lb

## 2019-03-21 DIAGNOSIS — Z6841 Body Mass Index (BMI) 40.0 and over, adult: Secondary | ICD-10-CM | POA: Insufficient documentation

## 2019-03-21 DIAGNOSIS — M545 Low back pain, unspecified: Secondary | ICD-10-CM | POA: Insufficient documentation

## 2019-03-21 DIAGNOSIS — R1031 Right lower quadrant pain: Secondary | ICD-10-CM | POA: Insufficient documentation

## 2019-03-21 DIAGNOSIS — R300 Dysuria: Secondary | ICD-10-CM | POA: Insufficient documentation

## 2019-03-21 DIAGNOSIS — G8929 Other chronic pain: Secondary | ICD-10-CM

## 2019-03-21 DIAGNOSIS — N1339 Other hydronephrosis: Secondary | ICD-10-CM | POA: Insufficient documentation

## 2019-03-21 DIAGNOSIS — L918 Other hypertrophic disorders of the skin: Secondary | ICD-10-CM

## 2019-03-21 LAB — POCT URINALYSIS DIPSTICK
Bilirubin, UA: NEGATIVE
Blood, UA: NEGATIVE
Glucose, UA: NEGATIVE
Ketones, UA: NEGATIVE
Leukocytes, UA: NEGATIVE
Nitrite, UA: NEGATIVE
Protein, UA: NEGATIVE
Spec Grav, UA: 1.01 (ref 1.010–1.025)
Urobilinogen, UA: 0.2 E.U./dL
pH, UA: 6 (ref 5.0–8.0)

## 2019-03-21 NOTE — Progress Notes (Signed)
Patient: Cameron Thompson Male    DOB: [birth year]   39 y.o.   MRN: [id number] Visit Date: 03/21/2019  Today's Provider: Jairo Ben, FNP   Chief Complaint  Patient presents with  . Follow-up   Subjective:     HPI Patient returns back to office today for one month follow up visit to follow up on chronic issues, he also has a concern of pain at the end of urination. Denies any hematuria.    Patient states that at last office visit he was referred out to dermatology for skin tag removal. Patient states that he found out his insurance would not cover visit and was told he would have to pay out of pocket to pay for removal. Patient wanted to discuss today if it necessary to have them removed.  He has some new pain with urination, he reports burning at the end of urination. He had a foley catheter during his hernia repair on 03/03/19. He has had no concerns and is doing well post procedure. He has had follow up with Dr. Lemar Livings.   He reports he noticed burning with urination one week after his hernia pain.   He has mild lower back pain, lower right intermittent for years.  Denies any bowel or bladder loss of control. Denies any paresthesias or saddle paresthesias.   Denies any hematuria.  He has had history of kidney stone- 2 years ago. He was able to pass it.  Upon chart review I see CT renal stone study in 03/13/2016 results as below.  Adrenals/Urinary Tract: Normal adrenal glands. Punctate lower pole left renal collecting system stone. Mild right-sided hydroureteronephrosis to the level of a 5 mm proximal to mid right ureteric stone on image 59/series 2. No distal ureteric or bladder calculi.IMPRESSION: 1. Mild right-sided urinary tract obstruction secondary to a mid to distal right ureteric calculus. 2. Left nephrolithiasis. 3. Rectosigmoid stool burden suggests constipation. 4. Fat containing periumbilical ventral abdominal wall hernia. Given increased density  within the herniated fat, recommend physical exam correlation to exclude a component of strangulation.  He denies any rectal pain or pressure.  Denies any concerns for STD's.No sexual activity since Screenings for STD's were negative on 02/06/2019.  Patient  denies any fever, body aches,chills, rash, chest pain, shortness of breath, nausea, vomiting, or diarrhea.     Allergies  Allergen Reactions  . Kiwi Extract Anaphylaxis     Current Outpatient Medications:  .  HYDROcodone-acetaminophen (NORCO/VICODIN) 5-325 MG tablet, Take 1 tablet by mouth every 4 (four) hours as needed for moderate pain., Disp: 15 tablet, Rfl: 0 .  mupirocin nasal ointment (BACTROBAN NASAL) 2 %, Apply small amount to right nostril twice daily as directed. (Patient not taking: Reported on 03/03/2019), Disp: 10 g, Rfl: 0 .  mupirocin ointment (BACTROBAN) 2 %, Apply 1 application topically 3 (three) times daily., Disp: , Rfl:  .  Vitamin D, Ergocalciferol, (DRISDOL) 1.25 MG (50000 UNIT) CAPS capsule, Take 1 capsule (50,000 Units total) by mouth every 7 (seven) days., Disp: 8 capsule, Rfl: 0 Not currently taking Hydrocodone. He reports he only took once.  Review of Systems  Constitutional: Negative.   HENT: Negative.   Respiratory: Negative.   Cardiovascular: Negative.   Gastrointestinal: Negative.        No change since surgery for umbilical hernia, healing well and doing well.   Genitourinary: Positive for dysuria. Negative for decreased urine volume, difficulty urinating, discharge, enuresis, flank pain, frequency, genital sores, hematuria, penile  pain, penile swelling, scrotal swelling, testicular pain and urgency.  Musculoskeletal: Positive for back pain. Negative for arthralgias, gait problem, joint swelling, myalgias, neck pain and neck stiffness.  Skin:       Multiple skin tags. None infected. Did not go to dermatology for removal due to cost.   Neurological: Negative.   Hematological: Negative.     Psychiatric/Behavioral: Negative.     Social History   Tobacco Use  . Smoking status: Never Smoker  . Smokeless tobacco: Never Used  Substance Use Topics  . Alcohol use: Yes    Comment: occasionally      Objective:   Today's Vitals   03/21/19 1052  BP: 118/82  Pulse: 87  Resp: 16  Temp: (!) 97.4 F (36.3 C)  TempSrc: Temporal  SpO2: 99%  Weight: (!) 340 lb 14.4 oz (154.6 kg)   Body mass index is 50.34 kg/m.  Physical Exam Vitals and nursing note reviewed.  Constitutional:      General: He is not in acute distress.    Appearance: Normal appearance. He is well-developed. He is obese. He is not ill-appearing, toxic-appearing or diaphoretic.     Comments: Patient is alert and oriented and responsive to questions Engages in eye contact with provider. Speaks in full sentences without any pauses without any shortness of breath or distress.    HENT:     Head: Normocephalic and atraumatic.     Jaw: There is normal jaw occlusion.     Salivary Glands: Right salivary gland is not diffusely enlarged or tender. Left salivary gland is not diffusely enlarged or tender.     Right Ear: Hearing, ear canal and external ear normal. No middle ear effusion. Tympanic membrane is not erythematous.     Left Ear: Hearing, ear canal and external ear normal.  No middle ear effusion. Tympanic membrane is not erythematous.     Nose: Nose normal. No congestion or rhinorrhea.     Right Nostril: No foreign body, epistaxis, septal hematoma or occlusion.     Right Turbinates: Not swollen or pale.     Left Turbinates: Not pale.     Right Sinus: No maxillary sinus tenderness or frontal sinus tenderness.     Left Sinus: No maxillary sinus tenderness or frontal sinus tenderness.     Mouth/Throat:     Mouth: Mucous membranes are moist.     Pharynx: Uvula midline. No oropharyngeal exudate.  Eyes:     General: Lids are normal. No scleral icterus.       Right eye: No discharge.        Left eye: No  discharge.     Conjunctiva/sclera: Conjunctivae normal.     Pupils: Pupils are equal, round, and reactive to light.  Neck:     Thyroid: No thyromegaly.     Vascular: Normal carotid pulses. No carotid bruit, hepatojugular reflux or JVD.     Trachea: Trachea and phonation normal. No tracheal tenderness or tracheal deviation.     Meningeal: Brudzinski's sign absent.  Cardiovascular:     Rate and Rhythm: Normal rate and regular rhythm.     Pulses: Normal pulses.     Heart sounds: Normal heart sounds, S1 normal and S2 normal. Heart sounds not distant. No murmur. No friction rub. No gallop.   Pulmonary:     Effort: Pulmonary effort is normal. No accessory muscle usage or respiratory distress.     Breath sounds: Normal breath sounds. No stridor. No wheezing, rhonchi or rales.  Chest:  Chest wall: No tenderness.  Abdominal:     General: Bowel sounds are normal. There is no distension.     Palpations: Abdomen is soft. There is no mass.     Tenderness: There is no abdominal tenderness. There is no right CVA tenderness, left CVA tenderness, guarding or rebound. Negative signs include Murphy's sign, Rovsing's sign, McBurney's sign, psoas sign and obturator sign.     Hernia: No hernia is present.       Comments: Hernia repair is healing,  no erythema, small scab at surgical site. No discharge.  Non tender surgical site.   Genitourinary:    Comments: Deferred by patient  no concerns at this time.  Declines rectal concerns/ exam.  Musculoskeletal:        General: No tenderness or deformity. Normal range of motion.     Cervical back: Full passive range of motion without pain, normal range of motion and neck supple. No rigidity or tenderness.     Comments: Patient moves on and off of exam table and in room without difficulty. Gait is normal in hall and in room. Patient is oriented to person place time and situation. Patient answers questions appropriately and engages in conversation.     Lymphadenopathy:     Head:     Right side of head: No submental, submandibular, tonsillar, preauricular, posterior auricular or occipital adenopathy.     Left side of head: No submental, submandibular, tonsillar, preauricular, posterior auricular or occipital adenopathy.     Cervical: No cervical adenopathy.  Skin:    General: Skin is warm and dry.     Capillary Refill: Capillary refill takes less than 2 seconds.     Coloration: Skin is not pale.     Findings: No erythema or rash.     Nails: There is no clubbing.          Comments: Multiple skin tags varying in size, flesh-colored with stalks,  no abnormality in color, bleeding or discharge.  He has no erythema and appears to have healed.   Neurological:     General: No focal deficit present.     Mental Status: He is alert and oriented to person, place, and time.     GCS: GCS eye subscore is 4. GCS verbal subscore is 5. GCS motor subscore is 6.     Cranial Nerves: Cranial nerves are intact. No cranial nerve deficit.     Sensory: Sensation is intact. No sensory deficit.     Motor: Motor function is intact. No weakness or abnormal muscle tone.     Coordination: Coordination is intact. Coordination normal.     Gait: Gait is intact. Gait normal.     Deep Tendon Reflexes: Reflexes are normal and symmetric. Reflexes normal.     Reflex Scores:      Tricep reflexes are 2+ on the right side and 2+ on the left side.      Brachioradialis reflexes are 2+ on the right side and 2+ on the left side.      Achilles reflexes are 2+ on the right side and 2+ on the left side. Psychiatric:        Mood and Affect: Mood normal.        Speech: Speech normal.        Behavior: Behavior normal.        Thought Content: Thought content normal.        Judgment: Judgment normal.    Results for orders placed or performed in  visit on 03/21/19 (from the past 24 hour(s))  POCT urinalysis dipstick     Status: Normal   Collection Time: 03/21/19 11:29 AM  Result  Value Ref Range   Color, UA yellow    Clarity, UA clear    Glucose, UA Negative Negative   Bilirubin, UA negative    Ketones, UA negative    Spec Grav, UA 1.010 1.010 - 1.025   Blood, UA negative    pH, UA 6.0 5.0 - 8.0   Protein, UA Negative Negative   Urobilinogen, UA 0.2 0.2 or 1.0 E.U./dL   Nitrite, UA negative    Leukocytes, UA Negative Negative   Appearance     Odor         Assessment & Plan    Dysuria - Plan: POCT urinalysis dipstick, Urinalysis, Routine w reflex microscopic, Urine Culture  Right lower quadrant abdominal pain - Plan: CT RENAL STONE STUDY  Other hydronephrosis- history of bilateral on 2018 CT renal stone  - Plan: CT RENAL STONE STUDY  Chronic right-sided low back pain without sciatica  Multiple acquired skin tags  Body mass index (BMI) of 50-59.9 in adult [facility])  Urine POCT was negative.  History of  Previous renal stone and bilateral hydronephrosis and will repeat renal stone study.  He has pain at end urination only and chronic right sided back pain without any other associated symptoms. Increase hydration with water. Report any new or worsening symptoms.   Orders Placed This Encounter  Procedures  . Urine Culture    [facility] employee ID # [id number]  . CT RENAL STONE STUDY    Standing Status:   Future    Standing Expiration Date:   03/30/2019    Order Specific Question:   ** REASON FOR EXAM (FREE TEXT)    Answer:   history of kidney stones and 2018 CT was abnormal with hydronepherosis both kidneys, has pain at end stream. Rule out kidney stone or persistent enlarged kidneys. he has had recent unbilical hernia repair    Order Specific Question:   Preferred imaging location?    Answer:   [facility]    Order Specific Question:   Radiology Contrast Protocol - do NOT remove file path    Answer:   [email or web address]  . Urinalysis, Routine w reflex microscopic    [facility] employee ID # [id number]  . POCT urinalysis  dipstick  Discussed dermatology and skin tags, normal etiology we can consider in the future removal in office if patient prefers, he can still go to dermatology as well.   Return in about 2 weeks (around 04/04/2019), or if symptoms worsen or fail to improve, for at any time for any worsening symptoms, Go to Emergency room/ urgent care if worse.   The entirety of the information documented in the History of Present Illness, Review of Systems and Physical Exam were personally obtained by me. Portions of this information were initially documented by the  Certified Medical Assistant whose name is documented in Epic and reviewed by me for thoroughness and accuracy.  I have personally performed the exam and reviewed the chart and it is accurate to the best of my knowledge.  Museum/gallery conservator has been used and any errors in dictation or transcription are unintentional.  Eula Fried. Flinchum FNP-C  [facility]   Jairo Ben, FNP  [facility]

## 2019-03-21 NOTE — Patient Instructions (Addendum)
 Calorie Counting for Weight Loss Calories are units of energy. Your body needs a certain amount of calories from food to keep you going throughout the day. When you eat more calories than your body needs, your body stores the extra calories as fat. When you eat fewer calories than your body needs, your body burns fat to get the energy it needs. Calorie counting means keeping track of how many calories you eat and drink each day. Calorie counting can be helpful if you need to lose weight. If you make sure to eat fewer calories than your body needs, you should lose weight. Ask your health care provider what a healthy weight is for you. For calorie counting to work, you will need to eat the right number of calories in a day in order to lose a healthy amount of weight per week. A dietitian can help you determine how many calories you need in a day and will give you suggestions on how to reach your calorie goal.  A healthy amount of weight to lose per week is usually 1-2 lb (0.5-0.9 kg). This usually means that your daily calorie intake should be reduced by 500-750 calories.  Eating 1,200 - 1,500 calories per day can help most women lose weight.  Eating 1,500 - 1,800 calories per day can help most men lose weight. What is my plan? My goal is to have __________ calories per day. If I have this many calories per day, I should lose around __________ pounds per week. What do I need to know about calorie counting? In order to meet your daily calorie goal, you will need to:  Find out how many calories are in each food you would like to eat. Try to do this before you eat.  Decide how much of the food you plan to eat.  Write down what you ate and how many calories it had. Doing this is called keeping a food log. To successfully lose weight, it is important to balance calorie counting with a healthy lifestyle that includes regular activity. Aim for 150 minutes of moderate exercise (such as walking) or 75  minutes of vigorous exercise (such as running) each week. Where do I find calorie information?  The number of calories in a food can be found on a Nutrition Facts label. If a food does not have a Nutrition Facts label, try to look up the calories online or ask your dietitian for help. Remember that calories are listed per serving. If you choose to have more than one serving of a food, you will have to multiply the calories per serving by the amount of servings you plan to eat. For example, the label on a package of bread might say that a serving size is 1 slice and that there are 90 calories in a serving. If you eat 1 slice, you will have eaten 90 calories. If you eat 2 slices, you will have eaten 180 calories. How do I keep a food log? Immediately after each meal, record the following information in your food log:  What you ate. Don't forget to include toppings, sauces, and other extras on the food.  How much you ate. This can be measured in cups, ounces, or number of items.  How many calories each food and drink had.  The total number of calories in the meal. Keep your food log near you, such as in a small notebook in your pocket, or use a mobile app or website. Some programs will   calculate calories for you and show you how many calories you have left for the day to meet your goal. What are some calorie counting tips?   Use your calories on foods and drinks that will fill you up and not leave you hungry: ? Some examples of foods that fill you up are nuts and nut butters, vegetables, lean proteins, and high-fiber foods like whole grains. High-fiber foods are foods with more than 5 g fiber per serving. ? Drinks such as sodas, specialty coffee drinks, alcohol, and juices have a lot of calories, yet do not fill you up.  Eat nutritious foods and avoid empty calories. Empty calories are calories you get from foods or beverages that do not have many vitamins or protein, such as candy, sweets, and  soda. It is better to have a nutritious high-calorie food (such as an avocado) than a food with few nutrients (such as a bag of chips).  Know how many calories are in the foods you eat most often. This will help you calculate calorie counts faster.  Pay attention to calories in drinks. Low-calorie drinks include water and unsweetened drinks.  Pay attention to nutrition labels for "low fat" or "fat free" foods. These foods sometimes have the same amount of calories or more calories than the full fat versions. They also often have added sugar, starch, or salt, to make up for flavor that was removed with the fat.  Find a way of tracking calories that works for you. Get creative. Try different apps or programs if writing down calories does not work for you. What are some portion control tips?  Know how many calories are in a serving. This will help you know how many servings of a certain food you can have.  Use a measuring cup to measure serving sizes. You could also try weighing out portions on a kitchen scale. With time, you will be able to estimate serving sizes for some foods.  Take some time to put servings of different foods on your favorite plates, bowls, and cups so you know what a serving looks like.  Try not to eat straight from a bag or box. Doing this can lead to overeating. Put the amount you would like to eat in a cup or on a plate to make sure you are eating the right portion.  Use smaller plates, glasses, and bowls to prevent overeating.  Try not to multitask (for example, watch TV or use your computer) while eating. If it is time to eat, sit down at a table and enjoy your food. This will help you to know when you are full. It will also help you to be aware of what you are eating and how much you are eating. What are tips for following this plan? Reading food labels  Check the calorie count compared to the serving size. The serving size may be smaller than what you are used to  eating.  Check the source of the calories. Make sure the food you are eating is high in vitamins and protein and low in saturated and trans fats. Shopping  Read nutrition labels while you shop. This will help you make healthy decisions before you decide to purchase your food.  Make a grocery list and stick to it. Cooking  Try to cook your favorite foods in a healthier way. For example, try baking instead of frying.  Use low-fat dairy products. Meal planning  Use more fruits and vegetables. Half of your plate should be  fruits and vegetables.  Include lean proteins like poultry and fish. How do I count calories when eating out?  Ask for smaller portion sizes.  Consider sharing an entree and sides instead of getting your own entree.  If you get your own entree, eat only half. Ask for a box at the beginning of your meal and put the rest of your entree in it so you are not tempted to eat it.  If calories are listed on the menu, choose the lower calorie options.  Choose dishes that include vegetables, fruits, whole grains, low-fat dairy products, and lean protein.  Choose items that are boiled, broiled, grilled, or steamed. Stay away from items that are buttered, battered, fried, or served with cream sauce. Items labeled "crispy" are usually fried, unless stated otherwise.  Choose water, low-fat milk, unsweetened iced tea, or other drinks without added sugar. If you want an alcoholic beverage, choose a lower calorie option such as a glass of wine or light beer.  Ask for dressings, sauces, and syrups on the side. These are usually high in calories, so you should limit the amount you eat.  If you want a salad, choose a garden salad and ask for grilled meats. Avoid extra toppings like bacon, cheese, or fried items. Ask for the dressing on the side, or ask for olive oil and vinegar or lemon to use as dressing.  Estimate how many servings of a food you are given. For example, a serving of  cooked rice is  cup or about the size of half a baseball. Knowing serving sizes will help you be aware of how much food you are eating at restaurants. The list below tells you how big or small some common portion sizes are based on everyday objects: ? 1 oz--4 stacked dice. ? 3 oz--1 deck of cards. ? 1 tsp--1 die. ? 1 Tbsp-- a ping-pong ball. ? 2 Tbsp--1 ping-pong ball. ?  cup-- baseball. ? 1 cup--1 baseball. Summary  Calorie counting means keeping track of how many calories you eat and drink each day. If you eat fewer calories than your body needs, you should lose weight.  A healthy amount of weight to lose per week is usually 1-2 lb (0.5-0.9 kg). This usually means reducing your daily calorie intake by 500-750 calories.  The number of calories in a food can be found on a Nutrition Facts label. If a food does not have a Nutrition Facts label, try to look up the calories online or ask your dietitian for help.  Use your calories on foods and drinks that will fill you up, and not on foods and drinks that will leave you hungry.  Use smaller plates, glasses, and bowls to prevent overeating. This information is not intended to replace advice given to you by your health care provider. Make sure you discuss any questions you have with your health care provider. Document Revised: 09/17/2017 Document Reviewed: 11/29/2015 Elsevier Patient Education  [place].    Dysuria Dysuria is pain or discomfort while urinating. The pain or discomfort may be felt in the part of your body that drains urine from the bladder (urethra) or in the surrounding tissue of the genitals. The pain may also be felt in the groin area, lower abdomen, or lower back. You may have to urinate frequently or have the sudden feeling that you have to urinate (urgency). Dysuria can affect both men and women, but it is more common in women. Dysuria can be caused by many  different things, including:  Urinary tract  infection.  Kidney stones or bladder stones.  Certain sexually transmitted infections (STIs), such as chlamydia.  Dehydration.  Inflammation of the tissues of the vagina.  Use of certain medicines.  Use of certain soaps or scented products that cause irritation. Follow these instructions at home: General instructions  Watch your condition for any changes.  Urinate often. Avoid holding urine for long periods of time.  After a bowel movement or urination, women should cleanse from front to back, using each tissue only once.  Urinate after sexual intercourse.  Keep all follow-up visits as told by your health care provider. This is important.  If you had any tests done to find the cause of dysuria, it is up to you to get your test results. Ask your health care provider, or the department that is doing the test, when your results will be ready. Eating and drinking   Drink enough fluid to keep your urine pale yellow.  Avoid caffeine, tea, and alcohol. They can irritate the bladder and make dysuria worse. In men, alcohol may irritate the prostate. Medicines  Take over-the-counter and prescription medicines only as told by your health care provider.  If you were prescribed an antibiotic medicine, take it as told by your health care provider. Do not stop taking the antibiotic even if you start to feel better. Contact a health care provider if:  You have a fever.  You develop pain in your back or sides.  You have nausea or vomiting.  You have blood in your urine.  You are not urinating as often as you usually do. Get help right away if:  Your pain is severe and not relieved with medicines.  You cannot eat or drink without vomiting.  You are confused.  You have a rapid heartbeat while at rest.  You have shaking or chills.  You feel extremely weak. Summary  Dysuria is pain or discomfort while urinating. Many different conditions can lead to dysuria.  If you have  dysuria, you may have to urinate frequently or have the sudden feeling that you have to urinate (urgency).  Watch your condition for any changes. Keep all follow-up visits as told by your health care provider.  Make sure that you urinate often and drink enough fluid to keep your urine pale yellow. This information is not intended to replace advice given to you by your health care provider. Make sure you discuss any questions you have with your health care provider. Document Revised: 12/11/2016 Document Reviewed: 10/15/2016 Elsevier Patient Education  2020 [employer].

## 2019-03-22 LAB — URINALYSIS, ROUTINE W REFLEX MICROSCOPIC
Bilirubin, UA: NEGATIVE
Glucose, UA: NEGATIVE
Ketones, UA: NEGATIVE
Leukocytes,UA: NEGATIVE
Nitrite, UA: NEGATIVE
Protein,UA: NEGATIVE
RBC, UA: NEGATIVE
Specific Gravity, UA: 1.013 (ref 1.005–1.030)
Urobilinogen, Ur: 0.2 mg/dL (ref 0.2–1.0)
pH, UA: 6 (ref 5.0–7.5)

## 2019-03-23 LAB — URINE CULTURE: Organism ID, Bacteria: NO GROWTH

## 2019-03-23 NOTE — Progress Notes (Signed)
No growth of bacteria in urine. Proceed with renal stone study as ordered. Return to office if any symptoms worsening at anytime.

## 2019-03-24 ENCOUNTER — Ambulatory Visit: Payer: Managed Care, Other (non HMO)

## 2019-03-30 ENCOUNTER — Ambulatory Visit: Payer: Managed Care, Other (non HMO) | Attending: Adult Health

## 2019-04-04 ENCOUNTER — Ambulatory Visit: Payer: Self-pay | Admitting: Adult Health

## 2019-05-11 ENCOUNTER — Ambulatory Visit: Payer: Managed Care, Other (non HMO) | Attending: Internal Medicine

## 2019-05-11 DIAGNOSIS — Z23 Encounter for immunization: Secondary | ICD-10-CM

## 2019-05-11 NOTE — Progress Notes (Signed)
   Covid-19 Vaccination Clinic  Name:  Pellegrino Kennard    MRN: [id number] DOB: 01-Jan-1981  05/11/2019  Mr. Berkovich was observed post Covid-19 immunization for 15 minutes without incident. He was provided with Vaccine Information Sheet and instruction to access the V-Safe system.   Mr. Navarro was instructed to call 911 with any severe reactions post vaccine: Marland Kitchen Difficulty breathing  . Swelling of face and throat  . A fast heartbeat  . A bad rash all over body  . Dizziness and weakness   Immunizations Administered    Name Date Dose VIS Date Route   Pfizer COVID-19 Vaccine 05/11/2019  8:12 AM 0.3 mL 03/08/2018 Intramuscular   Manufacturer: [employer], [employer]   Lot: [id number]   NDC: [address]-4

## 2019-06-06 ENCOUNTER — Ambulatory Visit: Payer: Managed Care, Other (non HMO) | Attending: Internal Medicine

## 2019-06-06 DIAGNOSIS — Z23 Encounter for immunization: Secondary | ICD-10-CM

## 2019-06-06 NOTE — Progress Notes (Signed)
   Covid-19 Vaccination Clinic  Name:  Cameron Thompson    MRN: [id number] DOB: [birth year]  06/06/2019  Cameron Thompson was observed post Covid-19 immunization for 15 minutes without incident. He was provided with Vaccine Information Sheet and instruction to access the V-Safe system.   Cameron Thompson was instructed to call 911 with any severe reactions post vaccine: Marland Kitchen Difficulty breathing  . Swelling of face and throat  . A fast heartbeat  . A bad rash all over body  . Dizziness and weakness   Immunizations Administered    Name Date Dose VIS Date Route   Pfizer COVID-19 Vaccine 06/06/2019  8:25 AM 0.3 mL 03/08/2018 Intramuscular   Manufacturer: [employer], [employer]   Lot: [id number]   NDC: [address]-5

## 2019-09-11 ENCOUNTER — Ambulatory Visit
Admission: EM | Admit: 2019-09-11 | Discharge: 2019-09-11 | Disposition: A | Discharge status: Home / Self Care | Payer: Managed Care, Other (non HMO) | Attending: Emergency Medicine | Admitting: Emergency Medicine

## 2019-09-11 ENCOUNTER — Ambulatory Visit (INDEPENDENT_AMBULATORY_CARE_PROVIDER_SITE_OTHER): Payer: Managed Care, Other (non HMO)

## 2019-09-11 ENCOUNTER — Other Ambulatory Visit: Payer: Self-pay

## 2019-09-11 DIAGNOSIS — K5900 Constipation, unspecified: Secondary | ICD-10-CM | POA: Insufficient documentation

## 2019-09-11 DIAGNOSIS — R0789 Other chest pain: Secondary | ICD-10-CM | POA: Insufficient documentation

## 2019-09-11 DIAGNOSIS — R079 Chest pain, unspecified: Secondary | ICD-10-CM

## 2019-09-11 HISTORY — DX: Obesity, unspecified: E66.9

## 2019-09-11 LAB — CBC WITH DIFFERENTIAL/PLATELET
Abs Immature Granulocytes: 0.06 10*3/uL (ref 0.00–0.07)
Basophils Absolute: 0.1 10*3/uL (ref 0.0–0.1)
Basophils Relative: 1 %
Eosinophils Absolute: 0.4 10*3/uL (ref 0.0–0.5)
Eosinophils Relative: 4 %
HCT: 46.9 % (ref 39.0–52.0)
Hemoglobin: 16 g/dL (ref 13.0–17.0)
Immature Granulocytes: 1 %
Lymphocytes Relative: 19 %
Lymphs Abs: 2.2 10*3/uL (ref 0.7–4.0)
MCH: 29.3 pg (ref 26.0–34.0)
MCHC: 34.1 g/dL (ref 30.0–36.0)
MCV: 85.9 fL (ref 80.0–100.0)
Monocytes Absolute: 0.8 10*3/uL (ref 0.1–1.0)
Monocytes Relative: 6 %
Neutro Abs: 8.2 10*3/uL — ABNORMAL HIGH (ref 1.7–7.7)
Neutrophils Relative %: 69 %
Platelets: 369 10*3/uL (ref 150–400)
RBC: 5.46 MIL/uL (ref 4.22–5.81)
RDW: 13.2 % (ref 11.5–15.5)
WBC: 11.8 10*3/uL — ABNORMAL HIGH (ref 4.0–10.5)
nRBC: 0 % (ref 0.0–0.2)

## 2019-09-11 LAB — COMPREHENSIVE METABOLIC PANEL
ALT: 41 U/L (ref 0–44)
AST: 29 U/L (ref 15–41)
Albumin: 4.3 g/dL (ref 3.5–5.0)
Alkaline Phosphatase: 64 U/L (ref 38–126)
Anion gap: 10 (ref 5–15)
BUN: 13 mg/dL (ref 6–20)
CO2: 25 mmol/L (ref 22–32)
Calcium: 9.2 mg/dL (ref 8.9–10.3)
Chloride: 103 mmol/L (ref 98–111)
Creatinine, Ser: 0.74 mg/dL (ref 0.61–1.24)
GFR calc Af Amer: >60 mL/min (ref >60)
GFR calc non Af Amer: >60 mL/min (ref >60)
Glucose, Bld: 99 mg/dL (ref 70–99)
Potassium: 4 mmol/L (ref 3.5–5.1)
Sodium: 138 mmol/L (ref 135–145)
Total Bilirubin: 0.4 mg/dL (ref 0.3–1.2)
Total Protein: 8 g/dL (ref 6.5–8.1)

## 2019-09-11 LAB — FIBRIN DERIVATIVES D-DIMER (ARMC ONLY): Fibrin derivatives D-dimer (ARMC): 410.91 ng/mL (FEU) (ref 0.00–499.00)

## 2019-09-11 LAB — TROPONIN I (HIGH SENSITIVITY): Troponin I (High Sensitivity): 3 ng/L (ref <18)

## 2019-09-11 LAB — LIPASE, BLOOD: Lipase: 25 U/L (ref 11–51)

## 2019-09-11 MED ORDER — NAPROXEN 500 MG PO TABS: 500.0000 mg | ORAL_TABLET | Freq: Two times a day (BID) | ORAL | 0 refills | Status: DC

## 2019-09-11 NOTE — Discharge Instructions (Addendum)
Try MiraLAX.  You must drink at least 2 L of water a day.  Take the Naprosyn for the chest wall pain.  Go immediately to the ER if pain returns, gets worse, does not resolve on its own, or for any other concerns.

## 2019-09-11 NOTE — ED Provider Notes (Signed)
HPI  SUBJECTIVE:  Cameron Thompson is a 39 y.o. male who presents with sharp right-sided upper abdominal/chest pain that then went to his left chest starting at 1300 today while standing at work.  It lasted until 1500. 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  He reports some nausea.  No radiation of his neck, down his arm, through to his back.  No diaphoresis, dizziness, headache, syncope, seizures.  No other abdominal pain.  No vomiting, diarrhea, coughing, wheezing.  He denies chest heaviness or pressure.  States that he has been moving over the past week, doing lots of heavy lifting.  He has never had symptoms like this before.  No recent viral illness.  He got both doses of the Covid vaccine in May.  He has not tried anything for symptoms.  No alleviating factors.  No exertional or positional component.  Chest pain is not associated with inspiration, torso rotation, or movement.  States that he has been having problems with his asthma over the past few days and has been using Primatene with improvement in his symptoms.  No surgeries in the past 4 weeks, recent immobilization, hemoptysis, no abdominal distention.  Had a normal bowel movement yesterday.  He has a past medical history of asthma, obesity, nephrolithiasis.  No history of MI, hypercholesterolemia, diabetes, hypertension, smoking, PE, DVT, cancer, aortic aneurysm, AAA, gallbladder disease.  Family history negative for PE, DVT, early MI.  ZOX:[id number]:Flinchum, Eula FriedMichelle S, FNP     Past Medical History:  Diagnosis Date  . Allergy   . Asthma    pollen season  . Gout   . History of kidney stones   . Obese   . Vitamin D deficiency     Past Surgical History:  Procedure Laterality Date  . NO PAST SURGERIES    . NO PAST SURGERIES    . VENTRAL HERNIA REPAIR N/A 03/03/2019   Procedure: HERNIA REPAIR UMBILICAL ADULT;  Surgeon: Earline MayotteByrnett,  Jeffrey W, MD;  Location: [facility] ORS;  Service: General;  Laterality: N/A;    Family History  Family history unknown: Yes    Social History   Tobacco Use  . Smoking status: Never Smoker  . Smokeless tobacco: Never Used  Vaping Use  . Vaping Use: Never used  Substance Use Topics  . Alcohol use: Yes    Comment: occasionally  . Drug use: No    No current facility-administered medications for this encounter.  Current Outpatient Medications:  .  albuterol (PROVENTIL) (2.5 MG/3ML) 0.083% nebulizer solution, Take 2.5 mg by nebulization every 6 (six) hours as needed for wheezing or shortness of breath., Disp: , Rfl:  .  fluticasone (FLOVENT HFA) 110 MCG/ACT inhaler, Inhale into the lungs 2 (two) times daily., Disp: , Rfl:  .  naproxen (NAPROSYN) 500 MG tablet, Take 1 tablet (500 mg total) by mouth 2 (two) times daily., Disp: 20 tablet, Rfl: 0  Allergies  Allergen Reactions  . Kiwi Extract Anaphylaxis     ROS  As noted in HPI.   Physical Exam  BP (!) 161/100 (BP Location: Right Arm)   Pulse (!) 103   Temp 98.3 F (36.8 C) (Oral)   Resp (!) 21   Ht 5\' 9"  (1.753 m)   Wt (!) 173.3 kg   SpO2 98%   BMI 56.41 kg/m   Constitutional: Well developed, well nourished, no acute distress Eyes:  EOMI, conjunctiva normal  bilaterally HENT: Normocephalic, atraumatic,mucus membranes moist Respiratory: Normal inspiratory effort, lungs clear bilaterally. Cardiovascular: Regular tachycardia, no murmurs rubs or gallops.  Positive right-sided and left-sided chest wall tenderness.  RP, PT 2+ and equal bilaterally. GI: Positive right upper quadrant tenderness.  Negative Murphy.  No periumbilical tenderness.  No other tenderness.  No guarding, rebound.  Active bowel sounds.  No distention.  No palpable masses. skin: No rash, skin intact Musculoskeletal:calves symmetric, nontender. Neurologic: Alert & oriented x 3, no focal neuro deficits Psychiatric: Speech and behavior appropriate   ED  Course   Medications - No data to display  Orders Placed This Encounter  Procedures  . DG Chest 2 View    Standing Status:   Standing    Number of Occurrences:   1    Order Specific Question:   Reason for Exam (SYMPTOM  OR DIAGNOSIS REQUIRED)    Answer:   chest pain  . DG Abd 1 View    Standing Status:   Standing    Number of Occurrences:   1    Order Specific Question:   Reason for Exam (SYMPTOM  OR DIAGNOSIS REQUIRED)    Answer:   chest pain  . Comprehensive metabolic panel    Standing Status:   Standing    Number of Occurrences:   1  . Lipase, blood    Standing Status:   Standing    Number of Occurrences:   1  . CBC with Differential    Standing Status:   Standing    Number of Occurrences:   1  . Fibrin derivatives D-Dimer    Standing Status:   Standing    Number of Occurrences:   1  . ED EKG    Standing Status:   Standing    Number of Occurrences:   1    Order Specific Question:   Reason for Exam    Answer:   Chest Pain  . EKG 12-Lead    Standing Status:   Standing    Number of Occurrences:   1    Results for orders placed or performed during the hospital encounter of 09/11/19 (from the past 24 hour(s))  Comprehensive metabolic panel     Status: None   Collection Time: 09/11/19  4:47 PM  Result Value Ref Range   Sodium 138 135 - 145 mmol/L   Potassium 4.0 3.5 - 5.1 mmol/L   Chloride 103 98 - 111 mmol/L   CO2 25 22 - 32 mmol/L   Glucose, Bld 99 70 - 99 mg/dL   BUN 13 6 - 20 mg/dL   Creatinine, Ser 1.61 0.61 - 1.24 mg/dL   Calcium 9.2 8.9 - 09.6 mg/dL   Total Protein 8.0 6.5 - 8.1 g/dL   Albumin 4.3 3.5 - 5.0 g/dL   AST 29 15 - 41 U/L   ALT 41 0 - 44 U/L   Alkaline Phosphatase 64 38 - 126 U/L   Total Bilirubin 0.4 0.3 - 1.2 mg/dL   GFR calc non Af Amer >60 >60 mL/min   GFR calc Af Amer >60 >60 mL/min   Anion gap 10 5 - 15  Lipase, blood     Status: None   Collection Time: 09/11/19  4:47 PM  Result Value Ref Range   Lipase 25 11 - 51 U/L  CBC with  Differential     Status: Abnormal   Collection Time: 09/11/19  4:47 PM  Result Value Ref Range   WBC 11.8 (H) 4.0 -  10.5 K/uL   RBC 5.46 4.22 - 5.81 MIL/uL   Hemoglobin 16.0 13.0 - 17.0 g/dL   HCT 27.0 39 - 52 %   MCV 85.9 80.0 - 100.0 fL   MCH 29.3 26.0 - 34.0 pg   MCHC 34.1 30.0 - 36.0 g/dL   RDW 62.3 76.2 - 83.1 %   Platelets 369 150 - 400 K/uL   nRBC 0.0 0.0 - 0.2 %   Neutrophils Relative % 69 %   Neutro Abs 8.2 (H) 1.7 - 7.7 K/uL   Lymphocytes Relative 19 %   Lymphs Abs 2.2 0.7 - 4.0 K/uL   Monocytes Relative 6 %   Monocytes Absolute 0.8 0 - 1 K/uL   Eosinophils Relative 4 %   Eosinophils Absolute 0.4 0 - 0 K/uL   Basophils Relative 1 %   Basophils Absolute 0.1 0 - 0 K/uL   Immature Granulocytes 1 %   Abs Immature Granulocytes 0.06 0.00 - 0.07 K/uL  Fibrin derivatives D-Dimer     Status: None   Collection Time: 09/11/19  4:47 PM  Result Value Ref Range   Fibrin derivatives D-dimer ([facility]) 410.91 0.00 - 499.00 ng/mL (FEU)  Troponin I (High Sensitivity)     Status: None   Collection Time: 09/11/19  4:47 PM  Result Value Ref Range   Troponin I (High Sensitivity) 3 <18 ng/L   DG Chest 2 View  Result Date: 09/11/2019 CLINICAL DATA:  Chest pain EXAM: CHEST - 2 VIEW COMPARISON:  None. FINDINGS: The heart size and mediastinal contours are within normal limits. Both lungs are clear. The visualized skeletal structures are unremarkable. IMPRESSION: No active cardiopulmonary disease. Electronically Signed   By: Jasmine Pang M.D.   On: 09/11/2019 17:48   DG Abd 1 View  Result Date: 09/11/2019 CLINICAL DATA:  Chest pain EXAM: ABDOMEN - 1 VIEW COMPARISON:  03/14/2016 FINDINGS: Nonobstructed gas pattern with moderate stool. Punctate stone over the lower pole of the left kidney. IMPRESSION: Nonobstructed gas pattern with moderate stool. Punctate left kidney stone. Electronically Signed   By: Jasmine Pang M.D.   On: 09/11/2019 17:44    ED Clinical Impression  1. Chest wall pain   2.  Constipation, unspecified constipation type      ED Assessment/Plan  EKG: Normal sinus rhythm, rate 94.  Left axis deviation.  No hypertrophy.  No ST elevation.  No change compared to EKG from November 2012.  Patient symptomatic while EKG was taken  Reviewed imaging independently.  Normal chest x-ray.  Nonobstructive gas pattern with moderate stool.  Punctate left kidney stone.  See radiology report for full details.  In the differential includes pneumothorax, PE given mild tachycardia, cholelithiasis, pancreatitis.  He was symptomatic while EKG was obtained.  He had no ST elevation, however will check troponin looking for ACS.  Doubt pneumonia given history.  Dissection is in the differential because his pain is bilateral but he has equal pulses upper and lower extremities. Doubt AAA given age.  He has no surgical abdomen on initial exam.  This could be musculoskeletal because he does have reproducible chest wall pain, or even constipation.  Checking CBC, CMP, lipase, troponin as symptoms started 4 hours ago, D-dimer, KUB and chest x-ray.  No evidence of pericarditis on EKG.  Doubt myocarditis.  Patient declined pain medication.  Labs, imaging reviewed.  Patient has moderate stool burden.  Mild leukocytosis, but otherwise CMP, troponin, D-dimer and lipase are all normal.  Will send home with MiraLAX.  Discussed  with patient that I cannot guarantee that this is not his heart, but given initial negative troponin, negative EKG I think ACS is less likely.  Sent home with Naprosyn for chest wall pain.  Strict ER return precautions given.  Discussed labs, imaging, MDM, treatment plan, and plan for follow-up with patient. Discussed sn/sx that should prompt return to the ED. patient agrees with plan.   Meds ordered this encounter  Medications  . naproxen (NAPROSYN) 500 MG tablet    Sig: Take 1 tablet (500 mg total) by mouth 2 (two) times daily.    Dispense:  20 tablet    Refill:  0    *This clinic  note was created using [employer], clinical (histocompatibility and immunogenetics). Therefore, there may be occasional mistakes despite careful proofreading.   ?    Domenick Gong, MD 09/12/19 [phone]

## 2019-09-11 NOTE — ED Triage Notes (Signed)
Pt states he was having SOB yesterday and today had chest and abd pain. Pain comes and goes but denies currently. [employer] checked him out and told him to go to Urgent care.

## 2020-02-19 NOTE — Progress Notes (Deleted)
      Established patient visit   Patient: Cameron Thompson   DOB: [birth year]   40 y.o. Male  MRN: [id number] Visit Date: 02/20/2020  Today's healthcare provider: Jairo Ben, FNP   No chief complaint on file.  Subjective    Headache  This is a new problem.    ***  {Show patient history (optional):23778::" "}   Medications: Outpatient Medications Prior to Visit  Medication Sig  . albuterol (PROVENTIL) (2.5 MG/3ML) 0.083% nebulizer solution Take 2.5 mg by nebulization every 6 (six) hours as needed for wheezing or shortness of breath.  . fluticasone (FLOVENT HFA) 110 MCG/ACT inhaler Inhale into the lungs 2 (two) times daily.  . naproxen (NAPROSYN) 500 MG tablet Take 1 tablet (500 mg total) by mouth 2 (two) times daily.   No facility-administered medications prior to visit.    Review of Systems  Neurological: Positive for headaches.    {Labs  Heme  Chem  Endocrine  Serology  Results Review (optional):23779::" "}   Objective    There were no vitals taken for this visit. {Show previous vital signs (optional):23777::" "}   Physical Exam  ***  No results found for any visits on 02/20/20.  Assessment & Plan     ***  No follow-ups on file.      {provider attestation***:1}   Jairo Ben, FNP  [facility] [phone] (phone) [phone] (fax)  [facility]

## 2020-02-20 ENCOUNTER — Ambulatory Visit: Payer: Managed Care, Other (non HMO) | Admitting: Adult Health

## 2020-02-27 ENCOUNTER — Ambulatory Visit
Admission: RE | Admit: 2020-02-27 | Discharge: 2020-02-27 | Disposition: A | Discharge status: Home / Self Care | Payer: Managed Care, Other (non HMO) | Source: Ambulatory Visit | Attending: Adult Health | Admitting: Adult Health

## 2020-02-27 ENCOUNTER — Other Ambulatory Visit: Payer: Self-pay

## 2020-02-27 ENCOUNTER — Ambulatory Visit: Payer: Managed Care, Other (non HMO) | Admitting: Adult Health

## 2020-02-27 ENCOUNTER — Encounter: Payer: Self-pay | Admitting: Adult Health

## 2020-02-27 VITALS — BP 146/100 | HR 95 | Temp 97.7°F | Resp 16 | Wt 341.8 lb

## 2020-02-27 DIAGNOSIS — R519 Headache, unspecified: Secondary | ICD-10-CM | POA: Insufficient documentation

## 2020-02-27 DIAGNOSIS — I10 Essential (primary) hypertension: Secondary | ICD-10-CM | POA: Diagnosis not present

## 2020-02-27 DIAGNOSIS — M542 Cervicalgia: Secondary | ICD-10-CM | POA: Diagnosis not present

## 2020-02-27 DIAGNOSIS — Z6841 Body Mass Index (BMI) 40.0 and over, adult: Secondary | ICD-10-CM

## 2020-02-27 DIAGNOSIS — E785 Hyperlipidemia, unspecified: Secondary | ICD-10-CM | POA: Insufficient documentation

## 2020-02-27 MED ORDER — BACLOFEN 10 MG PO TABS: 10.0000 mg | ORAL_TABLET | Freq: Three times a day (TID) | ORAL | 0 refills | Status: DC

## 2020-02-27 MED ORDER — HYDROCHLOROTHIAZIDE 25 MG PO TABS: 25.0000 mg | ORAL_TABLET | Freq: Every day | ORAL | 0 refills | Status: DC

## 2020-02-27 NOTE — Progress Notes (Signed)
Established patient visit   Patient: Cameron Thompson   DOB: December 16, 1980   40 y.o. Male  MRN: [id number] Visit Date: 02/27/2020  Today's healthcare provider: Jairo Ben, FNP   Chief Complaint  Patient presents with  . Headache   Subjective    HPI HPI    Headache    Location Descriptor: Back of head, to the front .  Characterized as stabbing (shooting).  This started 4 weeks.  Occurs daily.  It is worse at random times.  Triggers include change in position (Bending, Yawning or Singing).  Since onset it is gradually worsening.  Treatments Attempted: Tylenol and Aleve.  Response to treatment was mild improvement.  Associated symptoms include headache, nausea, neck stiffness and neck pain.  Negative for eye pain, redness, blurred vision, dim vision, vision loss, transient vision loss, double vision, tearing, pain with eye movement, congestion, postnasal drip, vomiting, photophobia, flashing lights, visual disturbance, aura, numbness, tingling, weakness, abnormal speech, abnormal gait, imbalance, dizziness, confusion, scalp tenderness, jaw claudication, shoulder/hip pain, fever, chills, weight loss, fatigue, rash, unequal pupils, droopy lid, trauma, pulsating sound and motion sickness.       Last edited by Fonda Kinder, CMA on 02/27/2020  8:30 AM. (History)     he feels like these headaches started when he was doing a boot camp, doing push ups. He pushed really hard and then had neck pain afterwards.   He still has some residual neck pain, after having massages. Massages helped him. Has improved some.  Nausea only when headaches are really bad occasionally.    Patient  denies any fever, body aches,chills, rash, chest pain, shortness of breath, nausea, vomiting, or diarrhea.  Denies dizziness, lightheadedness, pre syncopal or syncopal episodes.       Medications: Outpatient Medications Prior to Visit  Medication Sig  . albuterol (PROVENTIL) (2.5 MG/3ML)  0.083% nebulizer solution Take 2.5 mg by nebulization every 6 (six) hours as needed for wheezing or shortness of breath.  . fluticasone (FLOVENT HFA) 110 MCG/ACT inhaler Inhale into the lungs 2 (two) times daily.  . naproxen (NAPROSYN) 500 MG tablet Take 1 tablet (500 mg total) by mouth 2 (two) times daily.   No facility-administered medications prior to visit.    Review of Systems  Constitutional: Positive for fatigue.  HENT: Negative.   Respiratory: Negative.   Cardiovascular: Negative.   Gastrointestinal: Negative.   Musculoskeletal: Positive for arthralgias and neck pain.  Skin: Negative.   Psychiatric/Behavioral: Negative.     Last CBC Lab Results  Component Value Date   WBC 11.8 (H) 09/11/2019   HGB 16.0 09/11/2019   HCT 46.9 09/11/2019   MCV 85.9 09/11/2019   MCH 29.3 09/11/2019   RDW 13.2 09/11/2019   PLT 369 09/11/2019   Last metabolic panel Lab Results  Component Value Date   GLUCOSE 99 09/11/2019   NA 138 09/11/2019   K 4.0 09/11/2019   CL 103 09/11/2019   CO2 25 09/11/2019   BUN 13 09/11/2019   CREATININE 0.74 09/11/2019   GFRNONAA >60 09/11/2019   GFRAA >60 09/11/2019   CALCIUM 9.2 09/11/2019   PROT 8.0 09/11/2019   ALBUMIN 4.3 09/11/2019   LABGLOB 2.5 02/06/2019   AGRATIO 1.8 02/06/2019   BILITOT 0.4 09/11/2019   ALKPHOS 64 09/11/2019   AST 29 09/11/2019   ALT 41 09/11/2019   ANIONGAP 10 09/11/2019   Last lipids Lab Results  Component Value Date   CHOL 195 02/06/2019   HDL 34 (  L) 02/06/2019   LDLCALC 108 (H) 02/06/2019   TRIG 308 (H) 02/06/2019   Last hemoglobin A1c Lab Results  Component Value Date   HGBA1C 5.6 02/06/2019   Last thyroid functions Lab Results  Component Value Date   TSH 1.160 02/06/2019   Last vitamin D Lab Results  Component Value Date   VD25OH 10.4 (L) 02/06/2019   Last vitamin B12 and Folate No results found for: VITAMINB12, FOLATE     Objective    BP (!) 146/100   Pulse 95   Temp 97.7 F (36.5 C)  (Oral)   Resp 16   Wt (!) 341 lb 12.8 oz (155 kg)   SpO2 97%   BMI 50.48 kg/m  BP Readings from Last 3 Encounters:  02/27/20 (!) 146/100  09/11/19 (!) 161/100  03/21/19 118/82   Wt Readings from Last 3 Encounters:  02/27/20 (!) 341 lb 12.8 oz (155 kg)  09/11/19 (!) 382 lb (173.3 kg)  03/21/19 (!) 340 lb 14.4 oz (154.6 kg)       Physical Exam Vitals and nursing note reviewed.  Constitutional:      General: He is active. He is not in acute distress.    Appearance: Normal appearance. He is well-developed and well-nourished. He is obese. He is not ill-appearing, toxic-appearing, sickly-appearing or diaphoretic.     Comments: Patient is alert and oriented and responsive to questions Engages in eye contact with provider. Speaks in full sentences without any pauses without any shortness of breath or distress.    HENT:     Head: Normocephalic and atraumatic.     Right Ear: Hearing, tympanic membrane, ear canal and external ear normal.     Left Ear: Hearing, tympanic membrane, ear canal and external ear normal.     Nose: Nose normal.     Mouth/Throat:     Mouth: Oropharynx is clear and moist and mucous membranes are normal.     Pharynx: Uvula midline. No oropharyngeal exudate.  Eyes:     General: Lids are normal. No scleral icterus.       Right eye: No discharge.        Left eye: No discharge.     Extraocular Movements: EOM normal.     Conjunctiva/sclera: Conjunctivae normal.     Pupils: Pupils are equal, round, and reactive to light.  Neck:     Thyroid: No thyromegaly.     Vascular: Normal carotid pulses. No carotid bruit, hepatojugular reflux or JVD.     Trachea: Trachea and phonation normal. No tracheal tenderness or tracheal deviation.     Meningeal: Brudzinski's sign absent.  Cardiovascular:     Rate and Rhythm: Normal rate and regular rhythm.     Pulses: Normal pulses and intact distal pulses.     Heart sounds: Normal heart sounds, S1 normal and S2 normal. Heart sounds  not distant. No murmur heard. No friction rub. No gallop.   Pulmonary:     Effort: Pulmonary effort is normal. No accessory muscle usage or respiratory distress.     Breath sounds: Normal breath sounds. No stridor. No wheezing or rales.  Chest:     Chest wall: No tenderness.  Abdominal:     General: Bowel sounds are normal. There is no distension. Aorta is normal.     Palpations: Abdomen is soft. There is no mass.     Tenderness: There is no abdominal tenderness. There is no guarding or rebound.     Hernia: No hernia is present.  Musculoskeletal:        General: No tenderness, deformity or edema. Normal range of motion.     Cervical back: Full passive range of motion without pain, normal range of motion and neck supple.     Comments: Patient moves on and off of exam table and in room without difficulty. Gait is normal in hall and in room. Patient is oriented to person place time and situation. Patient answers questions appropriately and engages in conversation.   Lymphadenopathy:     Head:     Right side of head: No submental, submandibular, tonsillar, preauricular, posterior auricular or occipital adenopathy.     Left side of head: No submental, submandibular, tonsillar, preauricular, posterior auricular or occipital adenopathy.     Cervical: No cervical adenopathy.     Upper Body:  No axillary adenopathy present. Skin:    General: Skin is warm, dry and intact.     Capillary Refill: Capillary refill takes less than 2 seconds.     Coloration: Skin is not pale.     Findings: No erythema or rash.     Nails: There is no clubbing or cyanosis.  Neurological:     Mental Status: He is alert and oriented to person, place, and time.     GCS: GCS eye subscore is 4. GCS verbal subscore is 5. GCS motor subscore is 6.     Cranial Nerves: No cranial nerve deficit.     Sensory: No sensory deficit.     Motor: No abnormal muscle tone.     Coordination: He displays a negative Romberg sign. Romberg  sign negative. Coordination normal.     Gait: Gait normal.     Deep Tendon Reflexes: Strength normal and reflexes are normal and symmetric. Reflexes normal.  Psychiatric:        Mood and Affect: Mood and affect normal.        Speech: Speech normal.        Behavior: Behavior normal.        Thought Content: Thought content normal.        Cognition and Memory: Cognition and memory normal.        Judgment: Judgment normal.      No results found for any visits on 02/27/20.  Assessment & Plan     1. Body mass index (BMI) of 50-59.9 in adult [facility]) Wants referral to bariatrics  - Ambulatory referral to General Surgery  2. Neck pain  - CBC with Differential/Platelet - Comprehensive Metabolic Panel (CMET) - - DG Cervical Spine Complete  3. Acute nonintractable headache, unspecified headache type   Meds ordered this encounter  Medications  . hydrochlorothiazide (HYDRODIURIL) 25 MG tablet    Sig: Take 1 tablet (25 mg total) by mouth daily.    Dispense:  90 tablet    Refill:  0  . baclofen (LIORESAL) 10 MG tablet    Sig: Take 1 tablet (10 mg total) by mouth 3 (three) times daily. Will cause drowsiness.    Dispense:  30 each    Refill:  0    4. Hypertension, unspecified type TSH - hydrochlorothiazide (HYDRODIURIL) 25 MG tablet; Take 1 tablet (25 mg total) by mouth daily.  Dispense: 90 tablet; Refill: 0 - Ambulatory referral to General Surgery - Bariatrics.  Keep log of blood pressures.  5. Hyperlipidemia, unspecified hyperlipidemia type  Return for fasting lipids.  - Lipid Panel w/o Chol/HDL Ratio   Red Flags discussed. The patient was given clear instructions to go to  ER or return to medical center if any red flags develop, symptoms do not improve, worsen or new problems develop. They verbalized understanding.  Return in about 3 weeks (around 03/19/2020), or if symptoms worsen or fail to improve, for at any time for any worsening symptoms, Go to Emergency room/ urgent care if  worse.     The entirety of the information documented in the History of Present Illness, Review of Systems and Physical Exam were personally obtained by me. Portions of this information were initially documented by the CMA and reviewed by me for thoroughness and accuracy.     Jairo BenMichelle Smith Darrill Vreeland, FNP  [facility] [phone] (phone) [phone] (fax)  [facility]

## 2020-02-27 NOTE — Patient Instructions (Signed)
Hydrochlorothiazide Capsules or Tablets What is this medicine? HYDROCHLOROTHIAZIDE (hye droe klor oh THYE a zide) is a diuretic. It helps you make more urine and to lose salt and excess water from your body. It treats swelling from heart, kidney, or liver disease. It also treats high blood pressure. This medicine may be used for other purposes; ask your health care provider or pharmacist if you have questions. COMMON BRAND NAME(S): Esidrix, Ezide, HydroDIURIL, Microzide, Oretic, Zide What should I tell my health care provider before I take this medicine? They need to know if you have any of these conditions:  diabetes  gout  kidney disease  liver disease  lupus  pancreatitis  an unusual or allergic reaction to hydrochlorothiazide, sulfa drugs, other medicines, foods, dyes, or preservatives  pregnant or trying to get pregnant  breast-feeding How should I use this medicine? Take this medicine by mouth. Take it as directed on the prescription label at the same time every day. You can take it with or without food. If it upsets your stomach, take it with food. Keep taking it unless your health care provider tells you to stop. Talk to your health care provider about the use of this medicine in children. While it may be prescribed for children as young as newborns for selected conditions, precautions do apply. Overdosage: If you think you have taken too much of this medicine contact a poison control center or emergency room at once. NOTE: This medicine is only for you. Do not share this medicine with others. What if I miss a dose? If you miss a dose, take it as soon as you can. If it is almost time for your next dose, take only that dose. Do not take double or extra doses. What may interact with this medicine?  cholestyramine  colestipol  digoxin  dofetilide  lithium  medicines for blood pressure  medicines for diabetes  medicines that relax muscles for surgery  other  diuretics  steroid medicines like prednisone or cortisone This list may not describe all possible interactions. Give your health care provider a list of all the medicines, herbs, non-prescription drugs, or dietary supplements you use. Also tell them if you smoke, drink alcohol, or use illegal drugs. Some items may interact with your medicine. What should I watch for while using this medicine? Visit your health care provider for regular check ups. Check your blood pressure as directed. Ask your health care provider what your blood pressure should be. Also, find out when you should contact him or her. Do not treat yourself for coughs, colds, or pain while you are using this medicine without asking your health care provider for advice. Some medicines may increase your blood pressure. You may get drowsy or dizzy. Do not drive, use machinery, or do anything that needs mental alertness until you know how this medicine affects you. Do not stand or sit up quickly, especially if you are an older patient. This reduces the risk of dizzy or fainting spells. Alcohol can make you more drowsy and dizzy. Avoid alcoholic drinks. Talk to your health care professional about your risk of skin cancer. You may be more at risk for skin cancer if you take this medicine. This medicine can make you more sensitive to the sun. Keep out of the sun. If you cannot avoid being in the sun, wear protective clothing and use sunscreen. Do not use sun lamps or tanning beds/booths. You may need to be on a special diet while taking this medicine.  Ask your health care provider. Also, find out how many glasses of fluids you need to drink each day. Check with your health care provider if you get an attack of severe diarrhea, nausea and vomiting, or if you sweat a lot. The loss of too much body fluid can make it dangerous for you to take this medicine. This medicine may increase blood sugar. Ask your healthcare provider if changes in diet or  medicines are needed if you have diabetes. What side effects may I notice from receiving this medicine? Side effects that you should report to your doctor or health care professional as soon as possible:  allergic reactions (skin rash, itching or hives; swelling of the face, lips, or tongue)  gout (severe pain, redness, or swelling in joints like the big toe)  high blood sugar (increased hunger, thirst or urination; unusually weak or tired; blurry vision)  kidney injury (trouble passing urine or change in the amount of urine)  low blood pressure (dizziness; feeling faint or lightheaded, falls; unusually weak or tired)  low potassium levels (trouble breathing; chest pain; dizziness; fast, irregular heartbeat; feeling faint or lightheaded, falls; muscle cramps or pain)  sudden change in vision or eye pain Side effects that usually do not require medical attention (report to your doctor or health care professional if they continue or are bothersome):  change in sex drive or performance  dry mouth  headache  stomach upset This list may not describe all possible side effects. Call your doctor for medical advice about side effects. You may report side effects to FDA at 1-800-FDA-1088. Where should I keep my medicine? Keep out of the reach of children and pets. Store at room temperature between 20 and 25 degrees C (68 and 77 degrees F). Protect from light and moisture. Keep the container tightly closed. Do not freeze. Get rid of any unused medicine after the expiration date. To get rid of medicines that are no longer needed or have expired:  Take the medicine to a medicine take-back program. Check with your pharmacy or law enforcement to find a location.  If you cannot return the medicine, check the label or package insert to see if the medicine should be thrown out in the garbage or flushed down the toilet. If you are not sure, ask your health care provider. If it is safe to put in the  trash, empty the medicine out of the container. Mix the medicine with cat litter, dirt, coffee grounds, or other unwanted substance. Seal the mixture in a bag or container. Put it in the trash. NOTE: This sheet is a summary. It may not cover all possible information. If you have questions about this medicine, talk to your doctor, pharmacist, or health care provider.  2021 Elsevier/Gold Standard (2019-11-08 17:16:00) Hypertension, Adult Hypertension is another name for high blood pressure. High blood pressure forces your heart to work harder to pump blood. This can cause problems over time. There are two numbers in a blood pressure reading. There is a top number (systolic) over a bottom number (diastolic). It is best to have a blood pressure that is below 120/80. Healthy choices can help lower your blood pressure, or you may need medicine to help lower it. What are the causes? The cause of this condition is not known. Some conditions may be related to high blood pressure. What increases the risk?  Smoking.  Having type 2 diabetes mellitus, high cholesterol, or both.  Not getting enough exercise or physical activity.  Being overweight.  Having too much fat, sugar, calories, or salt (sodium) in your diet.  Drinking too much alcohol.  Having long-term (chronic) kidney disease.  Having a family history of high blood pressure.  Age. Risk increases with age.  Race. You may be at higher risk if you are African American.  Gender. Men are at higher risk than women before age 46. After age 15, women are at higher risk than men.  Having obstructive sleep apnea.  Stress. What are the signs or symptoms?  High blood pressure may not cause symptoms. Very high blood pressure (hypertensive crisis) may cause: ? Headache. ? Feelings of worry or nervousness (anxiety). ? Shortness of breath. ? Nosebleed. ? A feeling of being sick to your stomach (nausea). ? Throwing up (vomiting). ? Changes in  how you see. ? Very bad chest pain. ? Seizures. How is this treated?  This condition is treated by making healthy lifestyle changes, such as: ? Eating healthy foods. ? Exercising more. ? Drinking less alcohol.  Your health care provider may prescribe medicine if lifestyle changes are not enough to get your blood pressure under control, and if: ? Your top number is above 130. ? Your bottom number is above 80.  Your personal target blood pressure may vary. Follow these instructions at home: Eating and drinking  If told, follow the DASH eating plan. To follow this plan: ? Fill one half of your plate at each meal with fruits and vegetables. ? Fill one fourth of your plate at each meal with whole grains. Whole grains include whole-wheat pasta, brown rice, and whole-grain bread. ? Eat or drink low-fat dairy products, such as skim milk or low-fat yogurt. ? Fill one fourth of your plate at each meal with low-fat (lean) proteins. Low-fat proteins include fish, chicken without skin, eggs, beans, and tofu. ? Avoid fatty meat, cured and processed meat, or chicken with skin. ? Avoid pre-made or processed food.  Eat less than 1,500 mg of salt each day.  Do not drink alcohol if: ? Your doctor tells you not to drink. ? You are pregnant, may be pregnant, or are planning to become pregnant.  If you drink alcohol: ? Limit how much you use to:  0-1 drink a day for women.  0-2 drinks a day for men. ? Be aware of how much alcohol is in your drink. In the [place] one drink equals one 12 oz bottle of beer (355 mL), one 5 oz glass of wine (148 mL), or one 1 oz glass of hard liquor (44 mL).   Lifestyle  Work with your doctor to stay at a healthy weight or to lose weight. Ask your doctor what the best weight is for you.  Get at least 30 minutes of exercise most days of the week. This may include walking, swimming, or biking.  Get at least 30 minutes of exercise that strengthens your muscles  (resistance exercise) at least 3 days a week. This may include lifting weights or doing Pilates.  Do not use any products that contain nicotine or tobacco, such as cigarettes, e-cigarettes, and chewing tobacco. If you need help quitting, ask your doctor.  Check your blood pressure at home as told by your doctor.  Keep all follow-up visits as told by your doctor. This is important.   Medicines  Take over-the-counter and prescription medicines only as told by your doctor. Follow directions carefully.  Do not skip doses of blood pressure medicine. The medicine does not work  as well if you skip doses. Skipping doses also puts you at risk for problems.  Ask your doctor about side effects or reactions to medicines that you should watch for. Contact a doctor if you:  Think you are having a reaction to the medicine you are taking.  Have headaches that keep coming back (recurring).  Feel dizzy.  Have swelling in your ankles.  Have trouble with your vision. Get help right away if you:  Get a very bad headache.  Start to feel mixed up (confused).  Feel weak or numb.  Feel faint.  Have very bad pain in your: ? Chest. ? Belly (abdomen).  Throw up more than once.  Have trouble breathing. Summary  Hypertension is another name for high blood pressure.  High blood pressure forces your heart to work harder to pump blood.  For most people, a normal blood pressure is less than 120/80.  Making healthy choices can help lower blood pressure. If your blood pressure does not get lower with healthy choices, you may need to take medicine. This information is not intended to replace advice given to you by your health care provider. Make sure you discuss any questions you have with your health care provider. Document Revised: 09/08/2017 Document Reviewed: 09/08/2017 Elsevier Patient Education  2021 [employer]. Cervical Sprain A cervical sprain is a stretch or tear in one or more of the  ligaments in the neck. Ligaments are the tissues that connect bones. Cervical sprains can range from mild to severe. Severe cervical sprains can cause the spinal bones (vertebrae) in the neck to be unstable. This can result in spinal cord damage and in serious nervous system problems. The time that it takes for a cervical sprain to heal depends on the cause and extent of the injury. Most cervical sprains heal in 4-6 weeks. What are the causes? Cervical sprains may be caused by trauma, such as an injury from a motor vehicle accident, a fall, or a sudden forward and backward whipping movement of the head and neck (whiplash injury). Mild cervical sprains may be caused by wear and tear over time. What increases the risk? The following factors may make you more likely to develop this condition:  Participating in activities that have a high risk of trauma to the neck. These include contact sports, auto racing, gymnastics, and diving.  Taking risks when driving or riding in a motor vehicle.  Osteoarthritis of the spine.  Poor strength and flexibility of the neck.  A previous neck injury.  Poor posture.  Spending long periods in certain positions that put stress on the neck, such as sitting at a computer for a long time. What are the signs or symptoms? Symptoms of this condition include:  Pain, soreness, stiffness, tenderness, swelling, or a burning sensation in the front, back, or sides of the neck, shoulders, or upper back.  Sudden tightening of neck muscles (spasms).  Limited ability to move the neck.  Headache.  Dizziness.  Nausea or vomiting.  Weakness, numbness, or tingling in a hand or an arm. Symptoms may develop right away after injury, or they may develop over a few days. In some cases, symptoms may go away with treatment and return (recur) over time. How is this diagnosed? This condition may be diagnosed based on:  Your medical history.  Your symptoms.  Any recent  injuries or known neck problems that you have, such as arthritis in the neck.  A physical exam.  Imaging tests, such as X-rays,  MRI, and CT scan. How is this treated? This condition is treated by resting and icing the injured area and doing physical therapy exercises. Heat therapy may be used 2-3 days after the injury occurred if there is no swelling. Depending on the severity of your condition, treatment may also include:  Keeping your neck in place (immobilized) for periods of time. This may be done using: ? A cervical collar. This supports your chin and the back of your head. ? A cervical traction device. This is a sling that holds up your head. The device removes weight and pressure from your neck, and it may help to relieve pain.  Medicines that help to relieve pain and inflammation.  Medicines that help to relax your muscles (muscle relaxants).  Surgery. This is rare. Follow these instructions at home: Medicines  Take over-the-counter and prescription medicines only as told by your health care provider.  Ask your health care provider if the medicine prescribed to you: ? Requires you to avoid driving or using heavy machinery. ? Can cause constipation. You may need to take these actions to prevent or treat constipation:  Drink enough fluid to keep your urine pale yellow.  Take over-the-counter or prescription medicines.  Eat foods that are high in fiber, such as beans, whole grains, and fresh fruits and vegetables.  Limit foods that are high in fat and processed sugars, such as fried or sweet foods.   If you have a cervical collar:  Wear the collar as told by your health care provider. Do not remove it unless told.  Ask before making any adjustments to your collar.  If you have long hair, keep it outside of the collar.  Ask your health care provider if you may remove the collar for cleaning and bathing. If so: ? Follow instructions about how to remove it safely. ? Clean  it by hand with mild soap and water and air-dry it completely. ? If your collar has removable pads, remove them every 1-2 days and wash them by hand with soap and water. Let them air-dry completely before putting them back in the collar.  Tell your health care provider if your skin under the collar has irritation or sores. Managing pain, stiffness, and swelling  If directed, use a cervical traction device as told.  If directed, put ice on the affected area. To do this: ? Put ice in a plastic bag. ? Place a towel between your skin and the bag. ? Leave the ice on for 20 minutes, 2-3 times a day.  If directed, apply heat to the affected area before you do your physical therapy or as often as told by your health care provider. Use the heat source that your health care provider recommends, such as a moist heat pack or a heating pad. ? Place a towel between your skin and the heat source. ? Leave the heat on for 20-30 minutes. ? Remove the heat if your skin turns bright red. This is especially important if you are unable to feel pain, heat, or cold. You may have a greater risk of getting burned.      Activity  Do not drive while wearing a cervical collar. If you do not have a cervical collar, ask if it is safe to drive while your neck heals.  Do not lift anything that is heavier than 10 lb (4.5 kg), or the limit that you are told, until your health care provider says that it is safe.  Rest as  told by your health care provider.  If physical therapy was prescribed, do exercises as told by your health care provider or physical therapist.  Return to your normal activities as told by your health care provider. Avoid positions and activities that make your symptoms worse. Ask your health care provider what activities are safe for you. General instructions  Do not use any products that contain nicotine or tobacco, such as cigarettes, e-cigarettes, and chewing tobacco. These can delay healing. If  you need help quitting, ask your health care provider.  Keep all follow-up visits as told by your health care provider or physical therapist. This is important. How is this prevented? To prevent a cervical sprain from happening again:  Use and maintain good posture. Make any needed adjustments to your workstation to help you do this.  Exercise regularly as told by your health care provider or physical therapist.  Avoid risky activities that may cause a cervical sprain. Contact a health care provider if you have:  Symptoms that get worse or do not get better after 2 weeks of treatment.  Pain that gets worse or does not get better with medicine.  New, unexplained symptoms.  Sores or irritated skin on your neck from wearing your cervical collar. Get help right away if:  You have severe pain.  You develop numbness, tingling, or weakness in any part of your body.  You cannot move a part of your body (you have paralysis).  You have neck pain along with severe dizziness or headache. Summary  A cervical sprain is a stretch or tear in one or more of the ligaments in the neck.  Cervical sprains may be caused by trauma, such as an injury from a motor vehicle accident, a fall, or a sudden forward and backward whipping movement of the head and neck (whiplash injury).  Symptoms may develop right away after injury, or they may develop over a few days.  This condition may be treated with rest, ice, heat, medicines, physical therapy, and surgery. This information is not intended to replace advice given to you by your health care provider. Make sure you discuss any questions you have with your health care provider. Document Revised: 09/07/2018 Document Reviewed: 09/07/2018 Elsevier Patient Education  2021 [employer].

## 2020-02-27 NOTE — Progress Notes (Signed)
Mild bone spurring at C 6 cervical spine. Can refer to orthopedics to see if they advise further imaging.  Loss of typical cervical curve which can be seen  with muscle spasm.Marland Kitchen  No fracture identified.

## 2020-02-28 ENCOUNTER — Other Ambulatory Visit: Payer: Self-pay | Admitting: Adult Health

## 2020-02-28 DIAGNOSIS — D72829 Elevated white blood cell count, unspecified: Secondary | ICD-10-CM | POA: Insufficient documentation

## 2020-02-28 DIAGNOSIS — E039 Hypothyroidism, unspecified: Secondary | ICD-10-CM

## 2020-02-28 LAB — COMPREHENSIVE METABOLIC PANEL
ALT: 44 IU/L (ref 0–44)
AST: 31 IU/L (ref 0–40)
Albumin/Globulin Ratio: 1.8 (ref 1.2–2.2)
Albumin: 4.8 g/dL (ref 4.0–5.0)
Alkaline Phosphatase: 79 IU/L (ref 44–121)
BUN/Creatinine Ratio: 12 (ref 9–20)
BUN: 10 mg/dL (ref 6–24)
Bilirubin Total: 0.3 mg/dL (ref 0.0–1.2)
CO2: 23 mmol/L (ref 20–29)
Calcium: 10 mg/dL (ref 8.7–10.2)
Chloride: 101 mmol/L (ref 96–106)
Creatinine, Ser: 0.86 mg/dL (ref 0.76–1.27)
GFR calc Af Amer: 125 mL/min/{1.73_m2} (ref >59)
GFR calc non Af Amer: 108 mL/min/{1.73_m2} (ref >59)
Globulin, Total: 2.7 g/dL (ref 1.5–4.5)
Glucose: 90 mg/dL (ref 65–99)
Potassium: 4.7 mmol/L (ref 3.5–5.2)
Sodium: 143 mmol/L (ref 134–144)
Total Protein: 7.5 g/dL (ref 6.0–8.5)

## 2020-02-28 LAB — CBC WITH DIFFERENTIAL/PLATELET
Basophils Absolute: 0.1 10*3/uL (ref 0.0–0.2)
Basos: 1 %
EOS (ABSOLUTE): 0.3 10*3/uL (ref 0.0–0.4)
Eos: 3 %
Hematocrit: 46.5 % (ref 37.5–51.0)
Hemoglobin: 15.5 g/dL (ref 13.0–17.7)
Immature Grans (Abs): 0 10*3/uL (ref 0.0–0.1)
Immature Granulocytes: 0 %
Lymphocytes Absolute: 2.6 10*3/uL (ref 0.7–3.1)
Lymphs: 21 %
MCH: 29.5 pg (ref 26.6–33.0)
MCHC: 33.3 g/dL (ref 31.5–35.7)
MCV: 89 fL (ref 79–97)
Monocytes Absolute: 0.9 10*3/uL (ref 0.1–0.9)
Monocytes: 7 %
Neutrophils Absolute: 8.5 10*3/uL — ABNORMAL HIGH (ref 1.4–7.0)
Neutrophils: 68 %
Platelets: 386 10*3/uL (ref 150–450)
RBC: 5.25 x10E6/uL (ref 4.14–5.80)
RDW: 14 % (ref 11.6–15.4)
WBC: 12.4 10*3/uL — ABNORMAL HIGH (ref 3.4–10.8)

## 2020-02-28 LAB — TSH: TSH: 5.69 u[IU]/mL — ABNORMAL HIGH (ref 0.450–4.500)

## 2020-02-28 MED ORDER — LEVOTHYROXINE SODIUM 50 MCG PO TABS: 50.0000 ug | ORAL_TABLET | Freq: Every day | ORAL | 0 refills | Status: DC

## 2020-02-28 MED ORDER — AMOXICILLIN-POT CLAVULANATE 875-125 MG PO TABS: 1.0000 | ORAL_TABLET | Freq: Two times a day (BID) | ORAL | 0 refills | Status: DC

## 2020-02-28 NOTE — Progress Notes (Signed)
Mild bone spurring at C 6 cervical spine. Can refer to orthopedics to see if they advise further imaging.  Loss of typical cervical curve which can be seen with muscle spasm.Cameron Thompson  No fracture identified.   WBC is elevated still, I have sent in Augmentin antibiotic to take as directed. Recheck CBC order added for  walk  in lab  2-3 weeks. Do recommend he proceed with the MRI ENT Dr. Willeen Cass ordered as well for asomnia.   Lab work shows he is hypothyroid this can contribute to some of his weight gain and fatigue, will start him on Synthroid 50 mcg  one tablet in the morning as directed. Not to take with other medications or supplements within 2 hours. Needs follow up on hypothyroidism in 3 months- office visit and recheck TSH.     Meds ordered this encounter Medications  amoxicillin-clavulanate (AUGMENTIN) 875-125 MG tablet   Sig: Take 1 tablet by mouth 2 (two) times daily.   Dispense:  20 tablet   Refill:  0  levothyroxine (SYNTHROID) 50 MCG tablet   Sig: Take 1 tablet (50 mcg total) by mouth daily before breakfast.   Dispense:  90 tablet   Refill:  0

## 2020-02-28 NOTE — Progress Notes (Signed)
  Meds ordered this encounter  Medications  . amoxicillin-clavulanate (AUGMENTIN) 875-125 MG tablet    Sig: Take 1 tablet by mouth 2 (two) times daily.    Dispense:  20 tablet    Refill:  0  . levothyroxine (SYNTHROID) 50 MCG tablet    Sig: Take 1 tablet (50 mcg total) by mouth daily before breakfast.    Dispense:  90 tablet    Refill:  0   Leukocytosis, unspecified type - Plan: amoxicillin-clavulanate (AUGMENTIN) 875-125 MG tablet, CBC with Differential/Platelet  Hypothyroidism, unspecified type

## 2020-03-19 ENCOUNTER — Telehealth: Payer: Self-pay

## 2020-03-19 ENCOUNTER — Other Ambulatory Visit: Payer: Self-pay

## 2020-03-19 ENCOUNTER — Encounter: Payer: Self-pay | Admitting: Adult Health

## 2020-03-19 ENCOUNTER — Ambulatory Visit: Payer: Managed Care, Other (non HMO) | Admitting: Adult Health

## 2020-03-19 VITALS — BP 129/91 | HR 100 | Temp 97.7°F | Resp 16 | Wt 341.2 lb

## 2020-03-19 DIAGNOSIS — I10 Essential (primary) hypertension: Secondary | ICD-10-CM

## 2020-03-19 DIAGNOSIS — H539 Unspecified visual disturbance: Secondary | ICD-10-CM | POA: Diagnosis not present

## 2020-03-19 DIAGNOSIS — Z6841 Body Mass Index (BMI) 40.0 and over, adult: Secondary | ICD-10-CM

## 2020-03-19 DIAGNOSIS — R519 Headache, unspecified: Secondary | ICD-10-CM

## 2020-03-19 DIAGNOSIS — E785 Hyperlipidemia, unspecified: Secondary | ICD-10-CM

## 2020-03-19 DIAGNOSIS — R42 Dizziness and giddiness: Secondary | ICD-10-CM

## 2020-03-19 DIAGNOSIS — M542 Cervicalgia: Secondary | ICD-10-CM | POA: Diagnosis not present

## 2020-03-19 DIAGNOSIS — E039 Hypothyroidism, unspecified: Secondary | ICD-10-CM

## 2020-03-19 DIAGNOSIS — E559 Vitamin D deficiency, unspecified: Secondary | ICD-10-CM

## 2020-03-19 DIAGNOSIS — Z862 Personal history of diseases of the blood and blood-forming organs and certain disorders involving the immune mechanism: Secondary | ICD-10-CM

## 2020-03-19 NOTE — Telephone Encounter (Signed)
FMLA forms have been faxed to [employer]. Pt advised and declined wanting a copy of forms. TNP

## 2020-03-19 NOTE — Progress Notes (Signed)
Established patient visit   Patient: Cameron Thompson   DOB: 09/05/1980   40 y.o. Male  MRN: [id number] Visit Date: 03/19/2020  Today's healthcare provider: Jairo Ben, FNP   Chief Complaint  Patient presents with  . Hypertension  . Follow-up   Subjective    HPI  Hypertension, follow-up  BP Readings from Last 3 Encounters:  03/19/20 (!) 129/91  02/27/20 (!) 146/100  09/11/19 (!) 161/100   Wt Readings from Last 3 Encounters:  03/19/20 (!) 341 lb 3.2 oz (154.8 kg)  02/27/20 (!) 341 lb 12.8 oz (155 kg)  09/11/19 (!) 382 lb (173.3 kg)     He was last seen for hypertension 1 months ago.  BP at that visit was 146/100. Management since that visit includes started patient on HCTZ 25mg  and keep track of blood pressure readings.  He reports poor compliance with treatment. Not taken since last visit. Also not taking synthroid.  He is not having side effects.  He is following a poor diet. Patient reports he eats one meal  A day He is not exercising. Walking 10k steps at work He does not smoke.  Use of agents associated with hypertension: NSAIDS.   Outside blood pressures are not being checked. Symptoms: No chest pain No chest pressure  No palpitations No syncope  No dyspnea No orthopnea  No paroxysmal nocturnal dyspnea No lower extremity edema   Pertinent labs: Lab Results  Component Value Date   CHOL 195 02/06/2019   HDL 34 (L) 02/06/2019   LDLCALC 108 (H) 02/06/2019   TRIG 308 (H) 02/06/2019   Lab Results  Component Value Date   NA 143 02/27/2020   K 4.7 02/27/2020   CREATININE 0.86 02/27/2020   GFRNONAA 108 02/27/2020   GFRAA 125 02/27/2020   GLUCOSE 90 02/27/2020     The 10-year ASCVD risk score 02/29/2020 DC Jr., et al., 2013) is: 2.5%   --------------------------------------------------------------------------------------------------- Follow up for acute intractable headache  The patient was last seen for this 1 months ago. Changes made  at last visit include patient was placed on Baclofen, since last visit patient states that headaches have remained the same and states that he takes otc Naproxen as well for headaches with no relief. 2014 He is also taking aleve. He has significant neck pain and occasional paresthesia right side. Denies any injury or trauma.  He reports good compliance with treatment. He feels that condition is Unchanged. He is not having side effects.     Patient  denies any fever, body aches,chills, rash, chest pain, shortness of breath, nausea, vomiting, or diarrhea.  Denies dizziness, lightheadedness, pre syncopal or syncopal episodes.   -----------------------------------------------------------------------------------------  Patient Active Problem List   Diagnosis Date Noted  . Hypothyroidism 02/28/2020  . Leukocytosis 02/28/2020  . Neck pain 02/27/2020  . Acute nonintractable headache 02/27/2020  . Hyperlipidemia 02/27/2020  . Hypertension 02/27/2020  . Chronic right-sided low back pain without sciatica 03/21/2019  . Other hydronephrosis- history of bilateral on 2018 CT renal stone  03/21/2019  . Right lower quadrant abdominal pain 03/21/2019  . Dysuria 03/21/2019  . Body mass index (BMI) of 50-59.9 in adult (HCC) 03/21/2019  . Vitamin D deficiency   . Accessory skin tags 01/27/2019  . Class 3 severe obesity due to excess calories without serious comorbidity with body mass index (BMI) of 45.0 to 49.9 in adult (HCC) 01/27/2019  . Routine health maintenance 01/27/2019   Past Medical History:  Diagnosis Date  .  Allergy   . Asthma    pollen season  . Gout   . History of kidney stones   . Obese   . Vitamin D deficiency    Allergies  Allergen Reactions  . Kiwi Extract Anaphylaxis       Medications: Outpatient Medications Prior to Visit  Medication Sig  . albuterol (PROVENTIL) (2.5 MG/3ML) 0.083% nebulizer solution Take 2.5 mg by nebulization every 6 (six) hours as needed for wheezing  or shortness of breath.  . baclofen (LIORESAL) 10 MG tablet Take 1 tablet (10 mg total) by mouth 3 (three) times daily. Will cause drowsiness.  . fluticasone (FLOVENT HFA) 110 MCG/ACT inhaler Inhale into the lungs 2 (two) times daily.  . hydrochlorothiazide (HYDRODIURIL) 25 MG tablet Take 1 tablet (25 mg total) by mouth daily.  Marland Kitchen levothyroxine (SYNTHROID) 50 MCG tablet Take 1 tablet (50 mcg total) by mouth daily before breakfast.  . naproxen (NAPROSYN) 500 MG tablet Take 1 tablet (500 mg total) by mouth 2 (two) times daily.  . [DISCONTINUED] amoxicillin-clavulanate (AUGMENTIN) 875-125 MG tablet Take 1 tablet by mouth 2 (two) times daily.   No facility-administered medications prior to visit.    Review of Systems  Constitutional: Positive for fatigue.  HENT: Negative.   Respiratory: Negative.   Cardiovascular: Negative.   Gastrointestinal: Negative.   Genitourinary: Negative.   Musculoskeletal: Negative.   Neurological: Positive for dizziness, light-headedness and headaches. Negative for tremors, seizures, syncope, facial asymmetry, speech difficulty, weakness and numbness.       Intermittent of all positive ROS.   Psychiatric/Behavioral: The patient is nervous/anxious.     Last CBC Lab Results  Component Value Date   WBC 12.4 (H) 02/27/2020   HGB 15.5 02/27/2020   HCT 46.5 02/27/2020   MCV 89 02/27/2020   MCH 29.5 02/27/2020   RDW 14.0 02/27/2020   PLT 386 02/27/2020   Last metabolic panel Lab Results  Component Value Date   GLUCOSE 90 02/27/2020   NA 143 02/27/2020   K 4.7 02/27/2020   CL 101 02/27/2020   CO2 23 02/27/2020   BUN 10 02/27/2020   CREATININE 0.86 02/27/2020   GFRNONAA 108 02/27/2020   GFRAA 125 02/27/2020   CALCIUM 10.0 02/27/2020   PROT 7.5 02/27/2020   ALBUMIN 4.8 02/27/2020   LABGLOB 2.7 02/27/2020   AGRATIO 1.8 02/27/2020   BILITOT 0.3 02/27/2020   ALKPHOS 79 02/27/2020   AST 31 02/27/2020   ALT 44 02/27/2020   ANIONGAP 10 09/11/2019   Last  lipids Lab Results  Component Value Date   CHOL 195 02/06/2019   HDL 34 (L) 02/06/2019   LDLCALC 108 (H) 02/06/2019   TRIG 308 (H) 02/06/2019   Last hemoglobin A1c Lab Results  Component Value Date   HGBA1C 5.6 02/06/2019   Last thyroid functions Lab Results  Component Value Date   TSH 5.690 (H) 02/27/2020   Last vitamin D Lab Results  Component Value Date   VD25OH 10.4 (L) 02/06/2019   Last vitamin B12 and Folate No results found for: VITAMINB12, FOLATE     Objective    BP (!) 129/91   Pulse 100   Temp 97.7 F (36.5 C) (Oral)   Resp 16   Wt (!) 341 lb 3.2 oz (154.8 kg)   SpO2 95%   BMI 50.39 kg/m  BP Readings from Last 3 Encounters:  03/19/20 (!) 129/91  02/27/20 (!) 146/100  09/11/19 (!) 161/100   Wt Readings from Last 3 Encounters:  03/19/20 Marland Kitchen)  341 lb 3.2 oz (154.8 kg)  02/27/20 (!) 341 lb 12.8 oz (155 kg)  09/11/19 (!) 382 lb (173.3 kg)       Physical Exam Vitals reviewed.  Constitutional:      General: He is active. He is not in acute distress.    Appearance: Normal appearance. He is well-developed and well-nourished. He is obese. He is not ill-appearing, toxic-appearing, sickly-appearing or diaphoretic.  HENT:     Head: Normocephalic and atraumatic.     Right Ear: Hearing, tympanic membrane, ear canal and external ear normal.     Left Ear: Hearing, tympanic membrane, ear canal and external ear normal.     Nose: Nose normal.     Mouth/Throat:     Mouth: Oropharynx is clear and moist and mucous membranes are normal.     Pharynx: Uvula midline. No oropharyngeal exudate.  Eyes:     General: Lids are normal. No scleral icterus.       Right eye: No discharge.        Left eye: No discharge.     Extraocular Movements: EOM normal.     Conjunctiva/sclera: Conjunctivae normal.     Pupils: Pupils are equal, round, and reactive to light.  Neck:     Thyroid: No thyromegaly.     Vascular: Normal carotid pulses. No carotid bruit, hepatojugular reflux or  JVD.     Trachea: Trachea and phonation normal. No tracheal tenderness or tracheal deviation.     Meningeal: Brudzinski's sign absent.  Cardiovascular:     Rate and Rhythm: Normal rate and regular rhythm.     Pulses: Normal pulses and intact distal pulses.     Heart sounds: Normal heart sounds, S1 normal and S2 normal. Heart sounds not distant. No murmur heard. No friction rub. No gallop.   Pulmonary:     Effort: Pulmonary effort is normal. No accessory muscle usage or respiratory distress.     Breath sounds: Normal breath sounds. No stridor. No wheezing or rales.  Chest:     Chest wall: No tenderness.  Abdominal:     General: Bowel sounds are normal. There is no distension. Aorta is normal.     Palpations: Abdomen is soft. There is no mass.     Tenderness: There is no abdominal tenderness. There is no guarding or rebound.  Musculoskeletal:        General: No tenderness, deformity or edema. Normal range of motion.     Cervical back: Full passive range of motion without pain, normal range of motion and neck supple.  Lymphadenopathy:     Head:     Right side of head: No submental, submandibular, tonsillar, preauricular, posterior auricular or occipital adenopathy.     Left side of head: No submental, submandibular, tonsillar, preauricular, posterior auricular or occipital adenopathy.     Cervical: No cervical adenopathy.     Upper Body:  No axillary adenopathy present. Skin:    General: Skin is warm, dry and intact.     Coloration: Skin is not pale.     Findings: No erythema or rash.     Nails: There is no clubbing or cyanosis.  Neurological:     Mental Status: He is alert and oriented to person, place, and time.     GCS: GCS eye subscore is 4. GCS verbal subscore is 5. GCS motor subscore is 6.     Cranial Nerves: Cranial nerves are intact. No cranial nerve deficit.     Sensory: Sensation is intact.  No sensory deficit.     Motor: Motor function is intact. No abnormal muscle tone.      Coordination: He displays a negative Romberg sign. Coordination is intact. Coordination normal.     Gait: Gait is intact.     Deep Tendon Reflexes: Strength normal and reflexes are normal and symmetric. Reflexes normal.     Reflex Scores:      Tricep reflexes are 2+ on the right side and 2+ on the left side.      Patellar reflexes are 2+ on the right side and 2+ on the left side. Psychiatric:        Mood and Affect: Mood and affect normal.        Speech: Speech normal.        Behavior: Behavior normal.        Thought Content: Thought content normal.        Cognition and Memory: Cognition and memory normal.        Judgment: Judgment normal.     No results found for any visits on 03/19/20.  Assessment & Plan      1. Frequent headaches He has not been taking his thyroid or blood pressure medication. He never went for MRI of Brain Dr. [facility] went.  MRI Brain is advised and ordered.   He was offered Imitrex and declined, he would like to continue his  Naproxen as directed. Red flags discussed. Will wait for imaging results, may need neurology  referral.  - CBC with Differential/Platelet - Lipid Panel w/o Chol/HDL Ratio - TSH - Comprehensive metabolic panel - MR Cervical Spine Wo Contrast - MR Brain W Wo Contrast; Future  2. Change in vision- with headaches.  Eye exam advised.  - MR Brain W Wo Contrast; Future - Amb Referral to Bariatric Surgery  3. Neck pain Abnormal x  Ray, neck pain has been persistent, no significant relief with muscle relaxer or antiinflamatory. Need to proceed with MRI .   4. Class 3 severe obesity due to excess calories without serious comorbidity with body mass index (BMI) of 45.0 to 49.9 in adult [facility])  - Amb Referral to Bariatric Surgery  5. BMI 50.0-59.9, adult [facility]) The patient is advised to begin progressive daily aerobic exercise program, follow a low fat, low cholesterol diet, attempt to lose weight, reduce salt in diet and cooking,  improve dietary compliance, continue current medications, continue current healthy lifestyle patterns and return for routine annual checkups. - Amb Referral to Bariatric Surgery  6. Hypertension, unspecified type Start HCTZ as prevuio - Amb Referral to Bariatric Surgery  7. Hyperlipidemia, unspecified hyperlipidemia type Needs recheck labs.  - Amb Referral to Bariatric Surgery  8. Hypothyroidism, unspecified type Recheck lab for baseline start Synthroid as previously prescribed.  - Amb Referral to Bariatric Surgery  9. Dizzinesses with headaches.  Has had occasionally.   10. Vitamin D deficiency Needs recheck of lab.  - VITAMIN D 25 Hydroxy (Vit-D Deficiency, Fractures)  11. History of leukocytosis.  Recheck CBC.   recommend an eye exam as well.   Return for fasting labs.  Orders Placed This Encounter  Procedures  . MR Cervical Spine Wo Contrast  . MR Brain W Wo Contrast  . CBC with Differential/Platelet  . Lipid Panel w/o Chol/HDL Ratio  . TSH  . Comprehensive metabolic panel  . VITAMIN D 25 Hydroxy (Vit-D Deficiency, Fractures)  . Amb Referral to Bariatric Surgery   FMLA given for two weeks forms filled out for  patient, 03/19/20 to 04/02/20 return to work 04/03/20. Return to office sooner if needed.  Start medications for hypertension and hypothyroidism as directed.  Return in about 3 months (around 06/19/2020), or if symptoms worsen or fail to improve, for at any time for any worsening symptoms, Go to Emergency room/ urgent care if worse.      Red Flags discussed. The patient was given clear instructions to go to ER or return to medical center if any red flags develop, symptoms do not improve, worsen or new problems develop. They verbalized understanding.  The entirety of the information documented in the History of Present Illness, Review of Systems and Physical Exam were personally obtained by me. Portions of this information were initially documented by the CMA and  reviewed by me for thoroughness and accuracy.      Jairo BenMichelle Smith Flinchum, FNP  [facility] [phone] (phone) [phone] (fax)  [facility]

## 2020-03-19 NOTE — Patient Instructions (Addendum)
[facility] [address] [place], [place] [address]  [place]:[phone] Fax: [phone] Hours (M-F): 8am - 5pm    Muscle Cramps and Spasms Muscle cramps and spasms are when muscles tighten by themselves. They usually get better within minutes. Muscle cramps are painful. They are usually stronger and last longer than muscle spasms. Muscle spasms may or may not be painful. They can last a few seconds or much longer. Cramps and spasms can affect any muscle, but they occur most often in the calf muscles of the leg. They are usually not caused by a serious problem. In many cases, the cause is not known. Some common causes include:  Doing more physical work or exercise than your body is ready for.  Using the muscles too much (overuse) by repeating certain movements too many times.  Staying in a certain position for a long time.  Playing a sport or doing an activity without preparing properly.  Using bad form or technique while playing a sport or doing an activity.  Not having enough water in your body (dehydration).  Injury.  Side effects of some medicines.  Low levels of the salts and minerals in your blood (electrolytes), such as low potassium or calcium. Follow these instructions at home: Managing pain and stiffness  Massage, stretch, and relax the muscle. Do this for many minutes at a time.  If told, put heat on tight or tense muscles as often as told by your doctor. Use the heat source that your doctor recommends, such as a moist heat pack or a heating pad. ? Place a towel between your skin and the heat source. ? Leave the heat on for 20-30 minutes. ? Remove the heat if your skin turns bright red. This is very important if you are not able to feel pain, heat, or cold. You may have a greater risk of getting burned.  If told, put ice on the affected area. This may help if you are sore or have pain after a cramp or spasm. ? Put ice in  a plastic bag. ? Place a towel between your skin and the bag. ? Leave the ice on for 20 minutes, 2-3 times a day.  Try taking hot showers or baths to help relax tight muscles.      Eating and drinking  Drink enough fluid to keep your pee (urine) pale yellow.  Eat a healthy diet to help ensure that your muscles work well. This should include: ? Fruits and vegetables. ? Lean protein. ? Whole grains. ? Low-fat or nonfat dairy products. General instructions  If you are having cramps often, avoid intense exercise for several days.  Take over-the-counter and prescription medicines only as told by your doctor.  Watch for any changes in your symptoms.  Keep all follow-up visits as told by your doctor. This is important. Contact a doctor if:  Your cramps or spasms get worse or happen more often.  Your cramps or spasms do not get better with time. Summary  Muscle cramps and spasms are when muscles tighten by themselves. They usually get better within minutes.  Cramps and spasms occur most often in the calf muscles of the leg.  Massage, stretch, and relax the muscle. This may help the cramp or spasm go away.  Drink enough fluid to keep your pee (urine) pale yellow. This information is not intended to replace advice given to you by your health care provider. Make sure you discuss any questions you have with  your health care provider. Document Revised: 05/24/2017 Document Reviewed: 05/24/2017 Elsevier Patient Education  2021 Elsevier Inc. Hypothyroidism  Hypothyroidism is when the thyroid gland does not make enough of certain hormones (it is underactive). The thyroid gland is a small gland located in the lower front part of the neck, just in front of the windpipe (trachea). This gland makes hormones that help control how the body uses food for energy (metabolism) as well as how the heart and brain function. These hormones also play a role in keeping your bones strong. When the thyroid  is underactive, it produces too little of the hormones thyroxine (T4) and triiodothyronine (T3). What are the causes? This condition may be caused by:  Hashimoto's disease. This is a disease in which the body's disease-fighting system (immune system) attacks the thyroid gland. This is the most common cause.  Viral infections.  Pregnancy.  Certain medicines.  Birth defects.  Past radiation treatments to the head or neck for cancer.  Past treatment with radioactive iodine.  Past exposure to radiation in the environment.  Past surgical removal of part or all of the thyroid.  Problems with a gland in the center of the brain (pituitary gland).  Lack of enough iodine in the diet. What increases the risk? You are more likely to develop this condition if:  You are male.  You have a family history of thyroid conditions.  You use a medicine called lithium.  You take medicines that affect the immune system (immunosuppressants). What are the signs or symptoms? Symptoms of this condition include:  Feeling as though you have no energy (lethargy).  Not being able to tolerate cold.  Weight gain that is not explained by a change in diet or exercise habits.  Lack of appetite.  Dry skin.  Coarse hair.  Menstrual irregularity.  Slowing of thought processes.  Constipation.  Sadness or depression. How is this diagnosed? This condition may be diagnosed based on:  Your symptoms, your medical history, and a physical exam.  Blood tests. You may also have imaging tests, such as an ultrasound or MRI. How is this treated? This condition is treated with medicine that replaces the thyroid hormones that your body does not make. After you begin treatment, it may take several weeks for symptoms to go away. Follow these instructions at home:  Take over-the-counter and prescription medicines only as told by your health care provider.  If you start taking any new medicines, tell  your health care provider.  Keep all follow-up visits as told by your health care provider. This is important. ? As your condition improves, your dosage of thyroid hormone medicine may change. ? You will need to have blood tests regularly so that your health care provider can monitor your condition. Contact a health care provider if:  Your symptoms do not get better with treatment.  You are taking thyroid hormone replacement medicine and you: ? Sweat a lot. ? Have tremors. ? Feel anxious. ? Lose weight rapidly. ? Cannot tolerate heat. ? Have emotional swings. ? Have diarrhea. ? Feel weak. Get help right away if you have:  Chest pain.  An irregular heartbeat.  A rapid heartbeat.  Difficulty breathing. Summary  Hypothyroidism is when the thyroid gland does not make enough of certain hormones (it is underactive).  When the thyroid is underactive, it produces too little of the hormones thyroxine (T4) and triiodothyronine (T3).  The most common cause is Hashimoto's disease, a disease in which the body's disease-fighting system (  immune system) attacks the thyroid gland. The condition can also be caused by viral infections, medicine, pregnancy, or past radiation treatment to the head or neck.  Symptoms may include weight gain, dry skin, constipation, feeling as though you do not have energy, and not being able to tolerate cold.  This condition is treated with medicine to replace the thyroid hormones that your body does not make. This information is not intended to replace advice given to you by your health care provider. Make sure you discuss any questions you have with your health care provider. Document Revised: 09/29/2019 Document Reviewed: 09/14/2019 Elsevier Patient Education  2021 Elsevier Inc.   Managing Your Hypertension Hypertension, also called high blood pressure, is when the force of the blood pressing against the walls of the arteries is too strong. Arteries are  blood vessels that carry blood from your heart throughout your body. Hypertension forces the heart to work harder to pump blood and may cause the arteries to become narrow or stiff. Understanding blood pressure readings Your personal target blood pressure may vary depending on your medical conditions, your age, and other factors. A blood pressure reading includes a higher number over a lower number. Ideally, your blood pressure should be below 120/80. You should know that:  The first, or top, number is called the systolic pressure. It is a measure of the pressure in your arteries as your heart beats.  The second, or bottom number, is called the diastolic pressure. It is a measure of the pressure in your arteries as the heart relaxes. Blood pressure is classified into four stages. Based on your blood pressure reading, your health care provider may use the following stages to determine what type of treatment you need, if any. Systolic pressure and diastolic pressure are measured in a unit called mmHg. Normal  Systolic pressure: below 120.  Diastolic pressure: below 80. Elevated  Systolic pressure: 120-129.  Diastolic pressure: below 80. Hypertension stage 1  Systolic pressure: 130-139.  Diastolic pressure: 80-89. Hypertension stage 2  Systolic pressure: 140 or above.  Diastolic pressure: 90 or above. How can this condition affect me? Managing your hypertension is an important responsibility. Over time, hypertension can damage the arteries and decrease blood flow to important parts of the body, including the brain, heart, and kidneys. Having untreated or uncontrolled hypertension can lead to:  A heart attack.  A stroke.  A weakened blood vessel (aneurysm).  Heart failure.  Kidney damage.  Eye damage.  Metabolic syndrome.  Memory and concentration problems.  Vascular dementia. What actions can I take to manage this condition? Hypertension can be managed by making  lifestyle changes and possibly by taking medicines. Your health care provider will help you make a plan to bring your blood pressure within a normal range. Nutrition  Eat a diet that is high in fiber and potassium, and low in salt (sodium), added sugar, and fat. An example eating plan is called the Dietary Approaches to Stop Hypertension (DASH) diet. To eat this way: ? Eat plenty of fresh fruits and vegetables. Try to fill one-half of your plate at each meal with fruits and vegetables. ? Eat whole grains, such as whole-wheat pasta, brown rice, or whole-grain bread. Fill about one-fourth of your plate with whole grains. ? Eat low-fat dairy products. ? Avoid fatty cuts of meat, processed or cured meats, and poultry with skin. Fill about one-fourth of your plate with lean proteins such as fish, chicken without skin, beans, eggs, and tofu. ? Avoid  pre-made and processed foods. These tend to be higher in sodium, added sugar, and fat.  Reduce your daily sodium intake. Most people with hypertension should eat less than 1,500 mg of sodium a day.   Lifestyle  Work with your health care provider to maintain a healthy body weight or to lose weight. Ask what an ideal weight is for you.  Get at least 30 minutes of exercise that causes your heart to beat faster (aerobic exercise) most days of the week. Activities may include walking, swimming, or biking.  Include exercise to strengthen your muscles (resistance exercise), such as weight lifting, as part of your weekly exercise routine. Try to do these types of exercises for 30 minutes at least 3 days a week.  Do not use any products that contain nicotine or tobacco, such as cigarettes, e-cigarettes, and chewing tobacco. If you need help quitting, ask your health care provider.  Control any long-term (chronic) conditions you have, such as high cholesterol or diabetes.  Identify your sources of stress and find ways to manage stress. This may include  meditation, deep breathing, or making time for fun activities.   Alcohol use  Do not drink alcohol if: ? Your health care provider tells you not to drink. ? You are pregnant, may be pregnant, or are planning to become pregnant.  If you drink alcohol: ? Limit how much you use to:  0-1 drink a day for women.  0-2 drinks a day for men. ? Be aware of how much alcohol is in your drink. In the [place] one drink equals one 12 oz bottle of beer (355 mL), one 5 oz glass of wine (148 mL), or one 1 oz glass of hard liquor (44 mL). Medicines Your health care provider may prescribe medicine if lifestyle changes are not enough to get your blood pressure under control and if:  Your systolic blood pressure is 130 or higher.  Your diastolic blood pressure is 80 or higher. Take medicines only as told by your health care provider. Follow the directions carefully. Blood pressure medicines must be taken as told by your health care provider. The medicine does not work as well when you skip doses. Skipping doses also puts you at risk for problems. Monitoring Before you monitor your blood pressure:  Do not smoke, drink caffeinated beverages, or exercise within 30 minutes before taking a measurement.  Use the bathroom and empty your bladder (urinate).  Sit quietly for at least 5 minutes before taking measurements. Monitor your blood pressure at home as told by your health care provider. To do this:  Sit with your back straight and supported.  Place your feet flat on the floor. Do not cross your legs.  Support your arm on a flat surface, such as a table. Make sure your upper arm is at heart level.  Each time you measure, take two or three readings one minute apart and record the results. You may also need to have your blood pressure checked regularly by your health care provider.   General information  Talk with your health care provider about your diet, exercise habits, and other lifestyle factors  that may be contributing to hypertension.  Review all the medicines you take with your health care provider because there may be side effects or interactions.  Keep all visits as told by your health care provider. Your health care provider can help you create and adjust your plan for managing your high blood pressure. Where to find more information  National Heart, Lung, and Blood Institute: PopSteam.is  American Heart Association: www.heart.org Contact a health care provider if:  You think you are having a reaction to medicines you have taken.  You have repeated (recurrent) headaches.  You feel dizzy.  You have swelling in your ankles.  You have trouble with your vision. Get help right away if:  You develop a severe headache or confusion.  You have unusual weakness or numbness, or you feel faint.  You have severe pain in your chest or abdomen.  You vomit repeatedly.  You have trouble breathing. These symptoms may represent a serious problem that is an emergency. Do not wait to see if the symptoms will go away. Get medical help right away. Call your local emergency services (911 in the [place] Do not drive yourself to the hospital. Summary  Hypertension is when the force of blood pumping through your arteries is too strong. If this condition is not controlled, it may put you at risk for serious complications.  Your personal target blood pressure may vary depending on your medical conditions, your age, and other factors. For most people, a normal blood pressure is less than 120/80.  Hypertension is managed by lifestyle changes, medicines, or both.  Lifestyle changes to help manage hypertension include losing weight, eating a healthy, low-sodium diet, exercising more, stopping smoking, and limiting alcohol. This information is not intended to replace advice given to you by your health care provider. Make sure you discuss any questions you have with your health care  provider. Document Revised: 02/03/2019 Document Reviewed: 11/29/2018 Elsevier Patient Education  2021 Elsevier Inc.   [email or web address].mata.[email or web address]">  DASH Eating Plan DASH stands for Dietary Approaches to Stop Hypertension. The DASH eating plan is a healthy eating plan that has been shown to:  Reduce high blood pressure (hypertension).  Reduce your risk for type 2 diabetes, heart disease, and stroke.  Help with weight loss. What are tips for following this plan? Reading food labels  Check food labels for the amount of salt (sodium) per serving. Choose foods with less than 5 percent of the Daily Value of sodium. Generally, foods with less than 300 milligrams (mg) of sodium per serving fit into this eating plan.  To find whole grains, look for the word "whole" as the first word in the ingredient list. Shopping  Buy products labeled as "low-sodium" or "no salt added."  Buy fresh foods. Avoid canned foods and pre-made or frozen meals. Cooking  Avoid adding salt when cooking. Use salt-free seasonings or herbs instead of table salt or sea salt. Check with your health care provider or pharmacist before using salt substitutes.  Do not fry foods. Cook foods using healthy methods such as baking, boiling, grilling, roasting, and broiling instead.  Cook with heart-healthy oils, such as olive, canola, avocado, soybean, or sunflower oil. Meal planning  Eat a balanced diet that includes: ? 4 or more servings of fruits and 4 or more servings of vegetables each day. Try to fill one-half of your plate with fruits and vegetables. ? 6-8 servings of whole grains each day. ? Less than 6 oz (170 g) of lean meat, poultry, or fish each day. A 3-oz (85-g) serving of meat is about the same size as a deck of cards. One egg equals 1 oz (28 g). ? 2-3 servings of low-fat dairy each day. One serving is 1 cup (237 mL). ? 1 serving of nuts, seeds, or beans 5 times each  week. ? 2-3 servings of heart-healthy  fats. Healthy fats called omega-3 fatty acids are found in foods such as walnuts, flaxseeds, fortified milks, and eggs. These fats are also found in cold-water fish, such as sardines, salmon, and mackerel.  Limit how much you eat of: ? Canned or prepackaged foods. ? Food that is high in trans fat, such as some fried foods. ? Food that is high in saturated fat, such as fatty meat. ? Desserts and other sweets, sugary drinks, and other foods with added sugar. ? Full-fat dairy products.  Do not salt foods before eating.  Do not eat more than 4 egg yolks a week.  Try to eat at least 2 vegetarian meals a week.  Eat more home-cooked food and less restaurant, buffet, and fast food.   Lifestyle  When eating at a restaurant, ask that your food be prepared with less salt or no salt, if possible.  If you drink alcohol: ? Limit how much you use to:  0-1 drink a day for women who are not pregnant.  0-2 drinks a day for men. ? Be aware of how much alcohol is in your drink. In the [place] one drink equals one 12 oz bottle of beer (355 mL), one 5 oz glass of wine (148 mL), or one 1 oz glass of hard liquor (44 mL). General information  Avoid eating more than 2,300 mg of salt a day. If you have hypertension, you may need to reduce your sodium intake to 1,500 mg a day.  Work with your health care provider to maintain a healthy body weight or to lose weight. Ask what an ideal weight is for you.  Get at least 30 minutes of exercise that causes your heart to beat faster (aerobic exercise) most days of the week. Activities may include walking, swimming, or biking.  Work with your health care provider or dietitian to adjust your eating plan to your individual calorie needs. What foods should I eat? Fruits All fresh, dried, or frozen fruit. Canned fruit in natural juice (without added sugar). Vegetables Fresh or frozen vegetables (raw, steamed, roasted, or  grilled). Low-sodium or reduced-sodium tomato and vegetable juice. Low-sodium or reduced-sodium tomato sauce and tomato paste. Low-sodium or reduced-sodium canned vegetables. Grains Whole-grain or whole-wheat bread. Whole-grain or whole-wheat pasta. Brown rice. Orpah Cobb. Bulgur. Whole-grain and low-sodium cereals. Pita bread. Low-fat, low-sodium crackers. Whole-wheat flour tortillas. Meats and other proteins Skinless chicken or [place]. Ground chicken or [place]. Pork with fat trimmed off. Fish and seafood. Egg whites. Dried beans, peas, or lentils. Unsalted nuts, nut butters, and seeds. Unsalted canned beans. Lean cuts of beef with fat trimmed off. Low-sodium, lean precooked or cured meat, such as sausages or meat loaves. Dairy Low-fat (1%) or fat-free (skim) milk. Reduced-fat, low-fat, or fat-free cheeses. Nonfat, low-sodium ricotta or cottage cheese. Low-fat or nonfat yogurt. Low-fat, low-sodium cheese. Fats and oils Soft margarine without trans fats. Vegetable oil. Reduced-fat, low-fat, or light mayonnaise and salad dressings (reduced-sodium). Canola, safflower, olive, avocado, soybean, and sunflower oils. Avocado. Seasonings and condiments Herbs. Spices. Seasoning mixes without salt. Other foods Unsalted popcorn and pretzels. Fat-free sweets. The items listed above may not be a complete list of foods and beverages you can eat. Contact a dietitian for more information. What foods should I avoid? Fruits Canned fruit in a light or heavy syrup. Fried fruit. Fruit in cream or butter sauce. Vegetables Creamed or fried vegetables. Vegetables in a cheese sauce. Regular canned vegetables (not low-sodium or reduced-sodium). Regular canned tomato sauce and paste (not low-sodium  or reduced-sodium). Regular tomato and vegetable juice (not low-sodium or reduced-sodium). Rosita Fire. Olives. Grains Baked goods made with fat, such as croissants, muffins, or some breads. Dry pasta or rice meal packs. Meats  and other proteins Fatty cuts of meat. Ribs. Fried meat. Tomasa Blase. Bologna, salami, and other precooked or cured meats, such as sausages or meat loaves. Fat from the back of a pig (fatback). Bratwurst. Salted nuts and seeds. Canned beans with added salt. Canned or smoked fish. Whole eggs or egg yolks. Chicken or [place] with skin. Dairy Whole or 2% milk, cream, and half-and-half. Whole or full-fat cream cheese. Whole-fat or sweetened yogurt. Full-fat cheese. Nondairy creamers. Whipped toppings. Processed cheese and cheese spreads. Fats and oils Butter. Stick margarine. Lard. Shortening. Ghee. Bacon fat. Tropical oils, such as coconut, palm kernel, or palm oil. Seasonings and condiments Onion salt, garlic salt, seasoned salt, table salt, and sea salt. Worcestershire sauce. Tartar sauce. Barbecue sauce. Teriyaki sauce. Soy sauce, including reduced-sodium. Steak sauce. Canned and packaged gravies. Fish sauce. Oyster sauce. Cocktail sauce. Store-bought horseradish. Ketchup. Mustard. Meat flavorings and tenderizers. Bouillon cubes. Hot sauces. Pre-made or packaged marinades. Pre-made or packaged taco seasonings. Relishes. Regular salad dressings. Other foods Salted popcorn and pretzels. The items listed above may not be a complete list of foods and beverages you should avoid. Contact a dietitian for more information. Where to find more information  National Heart, Lung, and Blood Institute: PopSteam.is  American Heart Association: www.heart.org  Academy of Nutrition and Dietetics: www.eatright.org  National Kidney Foundation: www.kidney.org Summary  The DASH eating plan is a healthy eating plan that has been shown to reduce high blood pressure (hypertension). It may also reduce your risk for type 2 diabetes, heart disease, and stroke.  When on the DASH eating plan, aim to eat more fresh fruits and vegetables, whole grains, lean proteins, low-fat dairy, and heart-healthy fats.  With the DASH  eating plan, you should limit salt (sodium) intake to 2,300 mg a day. If you have hypertension, you may need to reduce your sodium intake to 1,500 mg a day.  Work with your health care provider or dietitian to adjust your eating plan to your individual calorie needs. This information is not intended to replace advice given to you by your health care provider. Make sure you discuss any questions you have with your health care provider. Document Revised: 12/02/2018 Document Reviewed: 12/02/2018 Elsevier Patient Education  2021 Elsevier Inc.   Calorie Counting for [employer] Calories are units of energy. Your body needs a certain number of calories from food to keep going throughout the day. When you eat or drink more calories than your body needs, your body stores the extra calories mostly as fat. When you eat or drink fewer calories than your body needs, your body burns fat to get the energy it needs. Calorie counting means keeping track of how many calories you eat and drink each day. Calorie counting can be helpful if you need to lose weight. If you eat fewer calories than your body needs, you should lose weight. Ask your health care provider what a healthy weight is for you. For calorie counting to work, you will need to eat the right number of calories each day to lose a healthy amount of weight per week. A dietitian can help you figure out how many calories you need in a day and will suggest ways to reach your calorie goal.  A healthy amount of weight to lose each week is usually 1-2  lb (0.5-0.9 kg). This usually means that your daily calorie intake should be reduced by 500-750 calories.  Eating 1,200-1,500 calories a day can help most women lose weight.  Eating 1,500-1,800 calories a day can help most men lose weight. What do I need to know about calorie counting? Work with your health care provider or dietitian to determine how many calories you should get each day. To meet your daily  calorie goal, you will need to:  Find out how many calories are in each food that you would like to eat. Try to do this before you eat.  Decide how much of the food you plan to eat.  Keep a food log. Do this by writing down what you ate and how many calories it had. To successfully lose weight, it is important to balance calorie counting with a healthy lifestyle that includes regular activity. Where do I find calorie information? The number of calories in a food can be found on a Nutrition Facts label. If a food does not have a Nutrition Facts label, try to look up the calories online or ask your dietitian for help. Remember that calories are listed per serving. If you choose to have more than one serving of a food, you will have to multiply the calories per serving by the number of servings you plan to eat. For example, the label on a package of bread might say that a serving size is 1 slice and that there are 90 calories in a serving. If you eat 1 slice, you will have eaten 90 calories. If you eat 2 slices, you will have eaten 180 calories.   How do I keep a food log? After each time that you eat, record the following in your food log as soon as possible:  What you ate. Be sure to include toppings, sauces, and other extras on the food.  How much you ate. This can be measured in cups, ounces, or number of items.  How many calories were in each food and drink.  The total number of calories in the food you ate. Keep your food log near you, such as in a pocket-sized notebook or on an app or website on your mobile phone. Some programs will calculate calories for you and show you how many calories you have left to meet your daily goal. What are some portion-control tips?  Know how many calories are in a serving. This will help you know how many servings you can have of a certain food.  Use a measuring cup to measure serving sizes. You could also try weighing out portions on a kitchen scale.  With time, you will be able to estimate serving sizes for some foods.  Take time to put servings of different foods on your favorite plates or in your favorite bowls and cups so you know what a serving looks like.  Try not to eat straight from a food's packaging, such as from a bag or box. Eating straight from the package makes it hard to see how much you are eating and can lead to overeating. Put the amount you would like to eat in a cup or on a plate to make sure you are eating the right portion.  Use smaller plates, glasses, and bowls for smaller portions and to prevent overeating.  Try not to multitask. For example, avoid watching TV or using your computer while eating. If it is time to eat, sit down at a table and enjoy your food.  This will help you recognize when you are full. It will also help you be more mindful of what and how much you are eating. What are tips for following this plan? Reading food labels  Check the calorie count compared with the serving size. The serving size may be smaller than what you are used to eating.  Check the source of the calories. Try to choose foods that are high in protein, fiber, and vitamins, and low in saturated fat, trans fat, and sodium. Shopping  Read nutrition labels while you shop. This will help you make healthy decisions about which foods to buy.  Pay attention to nutrition labels for low-fat or fat-free foods. These foods sometimes have the same number of calories or more calories than the full-fat versions. They also often have added sugar, starch, or salt to make up for flavor that was removed with the fat.  Make a grocery list of lower-calorie foods and stick to it. Cooking  Try to cook your favorite foods in a healthier way. For example, try baking instead of frying.  Use low-fat dairy products. Meal planning  Use more fruits and vegetables. One-half of your plate should be fruits and vegetables.  Include lean proteins, such as  chicken, [place], and fish. Lifestyle Each week, aim to do one of the following:  150 minutes of moderate exercise, such as walking.  75 minutes of vigorous exercise, such as running. General information  Know how many calories are in the foods you eat most often. This will help you calculate calorie counts faster.  Find a way of tracking calories that works for you. Get creative. Try different apps or programs if writing down calories does not work for you. What foods should I eat?  Eat nutritious foods. It is better to have a nutritious, high-calorie food, such as an avocado, than a food with few nutrients, such as a bag of potato chips.  Use your calories on foods and drinks that will fill you up and will not leave you hungry soon after eating. ? Examples of foods that fill you up are nuts and nut butters, vegetables, lean proteins, and high-fiber foods such as whole grains. High-fiber foods are foods with more than 5 g of fiber per serving.  Pay attention to calories in drinks. Low-calorie drinks include water and unsweetened drinks. The items listed above may not be a complete list of foods and beverages you can eat. Contact a dietitian for more information.   What foods should I limit? Limit foods or drinks that are not good sources of vitamins, minerals, or protein or that are high in unhealthy fats. These include:  Candy.  Other sweets.  Sodas, specialty coffee drinks, alcohol, and juice. The items listed above may not be a complete list of foods and beverages you should avoid. Contact a dietitian for more information. How do I count calories when eating out?  Pay attention to portions. Often, portions are much larger when eating out. Try these tips to keep portions smaller: ? Consider sharing a meal instead of getting your own. ? If you get your own meal, eat only half of it. Before you start eating, ask for a container and put half of your meal into it. ? When available,  consider ordering smaller portions from the menu instead of full portions.  Pay attention to your food and drink choices. Knowing the way food is cooked and what is included with the meal can help you eat fewer calories. ?  If calories are listed on the menu, choose the lower-calorie options. ? Choose dishes that include vegetables, fruits, whole grains, low-fat dairy products, and lean proteins. ? Choose items that are boiled, broiled, grilled, or steamed. Avoid items that are buttered, battered, fried, or served with cream sauce. Items labeled as crispy are usually fried, unless stated otherwise. ? Choose water, low-fat milk, unsweetened iced tea, or other drinks without added sugar. If you want an alcoholic beverage, choose a lower-calorie option, such as a glass of wine or light beer. ? Ask for dressings, sauces, and syrups on the side. These are usually high in calories, so you should limit the amount you eat. ? If you want a salad, choose a garden salad and ask for grilled meats. Avoid extra toppings such as bacon, cheese, or fried items. Ask for the dressing on the side, or ask for olive oil and vinegar or lemon to use as dressing.  Estimate how many servings of a food you are given. Knowing serving sizes will help you be aware of how much food you are eating at restaurants. Where to find more information  Centers for Disease Control and Prevention: FootballExhibition.com.br  [place] Department of Agriculture: WrestlingReporter.dk Summary  Calorie counting means keeping track of how many calories you eat and drink each day. If you eat fewer calories than your body needs, you should lose weight.  A healthy amount of weight to lose per week is usually 1-2 lb (0.5-0.9 kg). This usually means reducing your daily calorie intake by 500-750 calories.  The number of calories in a food can be found on a Nutrition Facts label. If a food does not have a Nutrition Facts label, try to look up the calories online or ask your  dietitian for help.  Use smaller plates, glasses, and bowls for smaller portions and to prevent overeating.  Use your calories on foods and drinks that will fill you up and not leave you hungry shortly after a meal. This information is not intended to replace advice given to you by your health care provider. Make sure you discuss any questions you have with your health care provider. Document Revised: 02/09/2019 Document Reviewed: 02/09/2019 Elsevier Patient Education  2021 Elsevier Inc.   Fat and Cholesterol Restricted Eating Plan Getting too much fat and cholesterol in your diet may cause health problems. Choosing the right foods helps keep your fat and cholesterol at normal levels. This can keep you from getting certain diseases. Your doctor may recommend an eating plan that includes:  Total fat: ______% or less of total calories a day.  Saturated fat: ______% or less of total calories a day.  Cholesterol: less than _________mg a day.  Fiber: ______g a day. What are tips for following this plan? Meal planning  At meals, divide your plate into four equal parts: ? Fill one-half of your plate with vegetables and green salads. ? Fill one-fourth of your plate with whole grains. ? Fill one-fourth of your plate with low-fat (lean) protein foods.  Eat fish that is high in omega-3 fats at least two times a week. This includes mackerel, tuna, sardines, and salmon.  Eat foods that are high in fiber, such as whole grains, beans, apples, broccoli, carrots, peas, and barley. General tips  Work with your doctor to lose weight if you need to.  Avoid: ? Foods with added sugar. ? Fried foods. ? Foods with partially hydrogenated oils.  Limit alcohol intake to no more than 1 drink a day  for nonpregnant women and 2 drinks a day for men. One drink equals 12 oz of beer, 5 oz of wine, or 1 oz of hard liquor.   Reading food labels  Check food labels for: ? Trans fats. ? Partially  hydrogenated oils. ? Saturated fat (g) in each serving. ? Cholesterol (mg) in each serving. ? Fiber (g) in each serving.  Choose foods with healthy fats, such as: ? Monounsaturated fats. ? Polyunsaturated fats. ? Omega-3 fats.  Choose grain products that have whole grains. Look for the word "whole" as the first word in the ingredient list. Cooking  Cook foods using low-fat methods. These include baking, boiling, grilling, and broiling.  Eat more home-cooked foods. Eat at restaurants and buffets less often.  Avoid cooking using saturated fats, such as butter, cream, palm oil, palm kernel oil, and coconut oil. Recommended foods Fruits  All fresh, canned (in natural juice), or frozen fruits. Vegetables  Fresh or frozen vegetables (raw, steamed, roasted, or grilled). Green salads. Grains  Whole grains, such as whole wheat or whole grain breads, crackers, cereals, and pasta. Unsweetened oatmeal, bulgur, barley, quinoa, or brown rice. Corn or whole wheat flour tortillas. Meats and other protein foods  Ground beef (85% or leaner), grass-fed beef, or beef trimmed of fat. Skinless chicken or [place]. Ground chicken or [place]. Pork trimmed of fat. All fish and seafood. Egg whites. Dried beans, peas, or lentils. Unsalted nuts or seeds. Unsalted canned beans. Nut butters without added sugar or oil. Dairy  Low-fat or nonfat dairy products, such as skim or 1% milk, 2% or reduced-fat cheeses, low-fat and fat-free ricotta or cottage cheese, or plain low-fat and nonfat yogurt. Fats and oils  Tub margarine without trans fats. Light or reduced-fat mayonnaise and salad dressings. Avocado. Olive, canola, sesame, or safflower oils. The items listed above may not be a complete list of foods and beverages you can eat. Contact a dietitian for more information.   Foods to avoid Fruits  Canned fruit in heavy syrup. Fruit in cream or butter sauce. Fried fruit. Vegetables  Vegetables cooked in cheese,  cream, or butter sauce. Fried vegetables. Grains  White bread. White pasta. White rice. Cornbread. Bagels, pastries, and croissants. Crackers and snack foods that contain trans fat and hydrogenated oils. Meats and other protein foods  Fatty cuts of meat. Ribs, chicken wings, bacon, sausage, bologna, salami, chitterlings, fatback, hot dogs, bratwurst, and packaged lunch meats. Liver and organ meats. Whole eggs and egg yolks. Chicken and [place] with skin. Fried meat. Dairy  Whole or 2% milk, cream, half-and-half, and cream cheese. Whole milk cheeses. Whole-fat or sweetened yogurt. Full-fat cheeses. Nondairy creamers and whipped toppings. Processed cheese, cheese spreads, and cheese curds. Beverages  Alcohol. Sugar-sweetened drinks such as sodas, lemonade, and fruit drinks. Fats and oils  Butter, stick margarine, lard, shortening, ghee, or bacon fat. Coconut, palm kernel, and palm oils. Sweets and desserts  Corn syrup, sugars, honey, and molasses. Candy. Jam and jelly. Syrup. Sweetened cereals. Cookies, pies, cakes, donuts, muffins, and ice cream. The items listed above may not be a complete list of foods and beverages you should avoid. Contact a dietitian for more information. Summary  Choosing the right foods helps keep your fat and cholesterol at normal levels. This can keep you from getting certain diseases.  At meals, fill one-half of your plate with vegetables and green salads.  Eat high-fiber foods, like whole grains, beans, apples, carrots, peas, and barley.  Limit added sugar, saturated fats, alcohol, and  fried foods. This information is not intended to replace advice given to you by your health care provider. Make sure you discuss any questions you have with your health care provider. Document Revised: 05/03/2019 Document Reviewed: 05/03/2019 Elsevier Patient Education  2021 [employer].

## 2020-03-22 ENCOUNTER — Telehealth: Payer: Self-pay

## 2020-03-22 NOTE — Telephone Encounter (Signed)
[facility] fax over provider statement to be completed. Forms placed in Michelle's box and blank copy is on file with medical records. Thanks TNP

## 2020-03-22 NOTE — Telephone Encounter (Signed)
FYI. KW 

## 2020-03-22 NOTE — Telephone Encounter (Signed)
Received 03/22/20 1209 will have filled out within 72 hours business days.

## 2020-03-28 ENCOUNTER — Other Ambulatory Visit: Payer: Self-pay | Admitting: Adult Health

## 2020-03-28 DIAGNOSIS — H539 Unspecified visual disturbance: Secondary | ICD-10-CM

## 2020-03-28 DIAGNOSIS — M542 Cervicalgia: Secondary | ICD-10-CM

## 2020-03-28 DIAGNOSIS — R42 Dizziness and giddiness: Secondary | ICD-10-CM

## 2020-03-28 DIAGNOSIS — R519 Headache, unspecified: Secondary | ICD-10-CM

## 2020-03-28 NOTE — Telephone Encounter (Signed)
Forms faxed to [employer]. Called pt no answer. LMTCB. If pt returns call please advise. Thanks TNP

## 2020-03-28 NOTE — Progress Notes (Signed)
Orders Placed This Encounter  Procedures  . Ambulatory referral to Neurology   

## 2020-03-29 ENCOUNTER — Other Ambulatory Visit: Payer: Self-pay | Admitting: Adult Health

## 2020-03-29 DIAGNOSIS — E039 Hypothyroidism, unspecified: Secondary | ICD-10-CM

## 2020-03-29 DIAGNOSIS — E559 Vitamin D deficiency, unspecified: Secondary | ICD-10-CM

## 2020-03-29 LAB — CBC WITH DIFFERENTIAL/PLATELET
Basophils Absolute: 0.1 10*3/uL (ref 0.0–0.2)
Basos: 1 %
EOS (ABSOLUTE): 0.3 10*3/uL (ref 0.0–0.4)
Eos: 3 %
Hematocrit: 44.7 % (ref 37.5–51.0)
Hemoglobin: 15 g/dL (ref 13.0–17.7)
Immature Grans (Abs): 0 10*3/uL (ref 0.0–0.1)
Immature Granulocytes: 1 %
Lymphocytes Absolute: 1.9 10*3/uL (ref 0.7–3.1)
Lymphs: 23 %
MCH: 29.5 pg (ref 26.6–33.0)
MCHC: 33.6 g/dL (ref 31.5–35.7)
MCV: 88 fL (ref 79–97)
Monocytes Absolute: 0.8 10*3/uL (ref 0.1–0.9)
Monocytes: 9 %
Neutrophils Absolute: 5.2 10*3/uL (ref 1.4–7.0)
Neutrophils: 63 %
Platelets: 304 10*3/uL (ref 150–450)
RBC: 5.08 x10E6/uL (ref 4.14–5.80)
RDW: 13.4 % (ref 11.6–15.4)
WBC: 8.2 10*3/uL (ref 3.4–10.8)

## 2020-03-29 LAB — LIPID PANEL W/O CHOL/HDL RATIO
Cholesterol, Total: 183 mg/dL (ref 100–199)
HDL: 30 mg/dL — ABNORMAL LOW (ref >39)
LDL Chol Calc (NIH): 96 mg/dL (ref 0–99)
Triglycerides: 336 mg/dL — ABNORMAL HIGH (ref 0–149)
VLDL Cholesterol Cal: 57 mg/dL — ABNORMAL HIGH (ref 5–40)

## 2020-03-29 LAB — COMPREHENSIVE METABOLIC PANEL
ALT: 32 IU/L (ref 0–44)
AST: 22 IU/L (ref 0–40)
Albumin/Globulin Ratio: 1.7 (ref 1.2–2.2)
Albumin: 4.2 g/dL (ref 4.0–5.0)
Alkaline Phosphatase: 80 IU/L (ref 44–121)
BUN/Creatinine Ratio: 9 (ref 9–20)
BUN: 7 mg/dL (ref 6–24)
Bilirubin Total: 0.2 mg/dL (ref 0.0–1.2)
CO2: 24 mmol/L (ref 20–29)
Calcium: 9.1 mg/dL (ref 8.7–10.2)
Chloride: 100 mmol/L (ref 96–106)
Creatinine, Ser: 0.77 mg/dL (ref 0.76–1.27)
Globulin, Total: 2.5 g/dL (ref 1.5–4.5)
Glucose: 106 mg/dL — ABNORMAL HIGH (ref 65–99)
Potassium: 4.9 mmol/L (ref 3.5–5.2)
Sodium: 141 mmol/L (ref 134–144)
Total Protein: 6.7 g/dL (ref 6.0–8.5)
eGFR: 115 mL/min/{1.73_m2} (ref >59)

## 2020-03-29 LAB — TSH: TSH: 2.23 u[IU]/mL (ref 0.450–4.500)

## 2020-03-29 LAB — VITAMIN D 25 HYDROXY (VIT D DEFICIENCY, FRACTURES): Vit D, 25-Hydroxy: 17.2 ng/mL — ABNORMAL LOW (ref 30.0–100.0)

## 2020-03-29 MED ORDER — VITAMIN D (ERGOCALCIFEROL) 1.25 MG (50000 UNIT) PO CAPS: 50000.0000 [IU] | ORAL_CAPSULE | ORAL | 0 refills | Status: DC

## 2020-03-29 NOTE — Progress Notes (Signed)
Vitamin  D is low, this can contribute to poor sleep and fatigue, will send in prescription for Vitamin D at 50,000 units by mouth once every 7 days/(once weekly) for 12 weeks. Advise recheck lab Vitamin D in 1-2 weeks after completing vitamin d prescription. Lab iis walk in and is closed during lunch during regular office hours. CBC is within normal limits and white blood cells now within normal limits.  Glucose elevated in CMP otherwise within normal limits. - please add on lab Hemoglobin A1C.  TSH is within normal limits, continue current synthroid dosage and recheck TSH in 3 months please add lab to orders.  Total cholesterol and triglycerides were elevated.  Discuss lifestyle modification with patient e.g. increase exercise, fiber, fruits, vegetables, lean meat, and omega 3/fish intake and decrease saturated fat.  If patient following strict diet and exercise program already please schedule follow up appointment with primary care physician   Recheck lipids and TSH in 3 months.

## 2020-03-29 NOTE — Progress Notes (Signed)
Meds ordered this encounter  Medications  . Vitamin D, Ergocalciferol, (DRISDOL) 1.25 MG (50000 UNIT) CAPS capsule    Sig: Take 1 capsule (50,000 Units total) by mouth every 7 (seven) days. (taking one tablet per week) walk in lab in office 1-2 weeks after completing prescription.    Dispense:  12 capsule    Refill:  0   Orders Placed This Encounter  Procedures  . TSH  . VITAMIN D 25 Hydroxy (Vit-D Deficiency, Fractures)   Labs recheck in  3 months is advised.

## 2020-03-29 NOTE — Progress Notes (Signed)
CBC within normal limits, White blood cell is within normal limits now.  Total cholesterol , triglycerides elevated.  Discuss lifestyle modification with patient e.g. increase exercise, fiber, fruits, vegetables, lean meat, and omega 3/fish intake and decrease saturated fat.  If patient following strict diet and exercise program already please schedule follow up appointment with primary care physician Better glucose control advised for triglycerides as well. Glucose elevated in CMP otherwise within normal limits.  TSH for thyroid within normal limits now. Continue Synthroid at current dosage and need recheck TSH in 3 months please add lab for walk in lab recheck at that time.  Please add on a Hemoglobin A1C.

## 2020-03-30 LAB — SPECIMEN STATUS REPORT

## 2020-03-30 LAB — HEMOGLOBIN A1C
Est. average glucose Bld gHb Est-mCnc: 117 mg/dL
Hgb A1c MFr Bld: 5.7 % — ABNORMAL HIGH (ref 4.8–5.6)

## 2020-04-01 NOTE — Progress Notes (Signed)
A1C prediabetes range, increased exercise and dietary changes advised.

## 2020-04-04 ENCOUNTER — Encounter: Payer: Self-pay | Admitting: Adult Health

## 2020-04-04 ENCOUNTER — Telehealth: Payer: Self-pay

## 2020-04-04 NOTE — Telephone Encounter (Signed)
Spoke with patient on the phone who states that his FMLA paperwork was accepted but that his employer needed a note from PCP stating that he can return back to work with no restrictions. Patient is asking that you send note to him on mychart. KW

## 2020-04-04 NOTE — Telephone Encounter (Signed)
Copied from CRM [id number]. Topic: General - Other >> Apr 04, 2020 10:10 AM Marylen Ponto wrote: Reason for CRM: Pt stated he needs Marvell Fuller or her nurse to prepare a return to work note. Pt requests call back

## 2020-04-04 NOTE — Telephone Encounter (Signed)
Complere sent to Mychart for patient.

## 2020-04-09 ENCOUNTER — Ambulatory Visit: Payer: Managed Care, Other (non HMO) | Admitting: Adult Health

## 2020-09-10 ENCOUNTER — Telehealth: Payer: Self-pay

## 2020-09-10 NOTE — Telephone Encounter (Signed)
Copied from CRM [id number]. Topic: Appointment Scheduling - Scheduling Inquiry for Clinic >> Sep 10, 2020  2:07 PM Laural Benes, [place] C wrote: Reason for CRM: pt called in to to schedule an apt for ankle, shoulder pain. Scheduled pt for next available, pt would like to know if possible could a provider see him sooner than scheduled?   Please assist pt further.

## 2020-09-10 NOTE — Telephone Encounter (Signed)
Apt rescheduled for 09/12/2020 at 1:20.   Thanks,   -Vernona Rieger

## 2020-09-12 ENCOUNTER — Ambulatory Visit: Payer: Managed Care, Other (non HMO) | Admitting: Family Medicine

## 2020-09-12 ENCOUNTER — Ambulatory Visit
Admission: RE | Admit: 2020-09-12 | Discharge: 2020-09-12 | Disposition: A | Discharge status: Home / Self Care | Payer: Managed Care, Other (non HMO) | Source: Ambulatory Visit | Attending: Family Medicine | Admitting: Family Medicine

## 2020-09-12 ENCOUNTER — Other Ambulatory Visit: Payer: Self-pay

## 2020-09-12 ENCOUNTER — Ambulatory Visit
Admission: RE | Admit: 2020-09-12 | Discharge: 2020-09-12 | Disposition: A | Discharge status: Home / Self Care | Payer: Managed Care, Other (non HMO) | Attending: Family Medicine | Admitting: Family Medicine

## 2020-09-12 ENCOUNTER — Encounter: Payer: Self-pay | Admitting: Family Medicine

## 2020-09-12 VITALS — BP 161/100 | HR 93 | Temp 98.4°F | Ht 70.0 in | Wt 375.0 lb

## 2020-09-12 DIAGNOSIS — I10 Essential (primary) hypertension: Secondary | ICD-10-CM

## 2020-09-12 DIAGNOSIS — E039 Hypothyroidism, unspecified: Secondary | ICD-10-CM | POA: Diagnosis not present

## 2020-09-12 DIAGNOSIS — G8929 Other chronic pain: Secondary | ICD-10-CM | POA: Insufficient documentation

## 2020-09-12 DIAGNOSIS — M25511 Pain in right shoulder: Secondary | ICD-10-CM | POA: Insufficient documentation

## 2020-09-12 DIAGNOSIS — Z6841 Body Mass Index (BMI) 40.0 and over, adult: Secondary | ICD-10-CM

## 2020-09-12 DIAGNOSIS — M25572 Pain in left ankle and joints of left foot: Secondary | ICD-10-CM

## 2020-09-12 DIAGNOSIS — M7711 Lateral epicondylitis, right elbow: Secondary | ICD-10-CM

## 2020-09-12 MED ORDER — LEVOTHYROXINE SODIUM 50 MCG PO TABS: 50.0000 ug | ORAL_TABLET | Freq: Every day | ORAL | 0 refills | Status: DC

## 2020-09-12 MED ORDER — HYDROCHLOROTHIAZIDE 25 MG PO TABS: 25.0000 mg | ORAL_TABLET | Freq: Every day | ORAL | 0 refills | Status: DC

## 2020-09-12 NOTE — Progress Notes (Signed)
Established patient visit   Patient: Cameron Thompson   DOB: April 25, 1980   40 y.o. Male  MRN: [id number] Visit Date: 09/12/2020  Today's healthcare provider: Dortha Kern, PA-C   No chief complaint on file.  Subjective  -------------------------------------------------------------------------------------------------------------------- HPI   Ankle Pain Patient complains of left ankle pain. Onset of the symptoms was several weeks ago. Current symptoms include:  pain when stretching foot, burning sensation . Aggravating factors: none. Symptoms have  not worse but more often . Patient  has had prior ankle problems but mainly when he is having issues with his gout and that is mainly with his right foot.  Previous visits for this problem: none.  Evaluation to date: none.  Treatment to date: none.  Shoulder Pain Patient complains of right shoulder pain. The symptoms began several weeks ago. Aggravating factors: reaching for something or raising arms too high. Laying on his right side when trying to sleep. Pain is located  above axillary area . Discomfort is described as aching and sharp/stabbing. Symptoms are exacerbated by repetitive movements, overhead movements, and lying on the shoulder. Evaluation to date: none. Therapy to date includes:  biofreeze and gave no relief .  Hypertension, follow-up  BP Readings from Last 3 Encounters:  03/19/20 (!) 129/91  02/27/20 (!) 146/100  09/11/19 (!) 161/100   Wt Readings from Last 3 Encounters:  03/19/20 (!) 341 lb 3.2 oz (154.8 kg)  02/27/20 (!) 341 lb 12.8 oz (155 kg)  09/11/19 (!) 382 lb (173.3 kg)     He was last seen for hypertension 6 months ago.  BP at that visit was 129/91. Management since that visit includes start HCTZ and Amb referral to Bariatric surgery.  He reports good compliance with treatment. He is not having side effects.  He is following a Regular diet. He is not exercising. He does not smoke.  Use of agents  associated with hypertension: none.   Outside blood pressures are none. Symptoms: No chest pain No chest pressure  No palpitations No syncope  No dyspnea No orthopnea  No paroxysmal nocturnal dyspnea No lower extremity edema   Pertinent labs: Lab Results  Component Value Date   CHOL 183 03/28/2020   HDL 30 (L) 03/28/2020   LDLCALC 96 03/28/2020   TRIG 336 (H) 03/28/2020   Lab Results  Component Value Date   NA 141 03/28/2020   K 4.9 03/28/2020   CREATININE 0.77 03/28/2020   GFRNONAA 108 02/27/2020   GFRAA 125 02/27/2020   GLUCOSE 106 (H) 03/28/2020     The 10-year ASCVD risk score Denman George DC Jr., et al., 2013) is: 2.6%    ---------------------------------------------------------------------------------------------------  Patient Active Problem List   Diagnosis Date Noted   Change in vision- with headaches.  03/19/2020   Dizzinesses with headaches.  03/19/2020   History of leukocytosis 03/19/2020   Hypothyroidism 02/28/2020   Leukocytosis 02/28/2020   Neck pain 02/27/2020   Frequent headaches 02/27/2020   Hyperlipidemia 02/27/2020   Hypertension 02/27/2020   Chronic right-sided low back pain without sciatica 03/21/2019   Other hydronephrosis- history of bilateral on 2018 CT renal stone  03/21/2019   Right lower quadrant abdominal pain 03/21/2019   Dysuria 03/21/2019   Body mass index (BMI) of 50-59.9 in adult [facility]) 03/21/2019   Vitamin D deficiency    Accessory skin tags 01/27/2019   Class 3 severe obesity due to excess calories without serious comorbidity with body mass index (BMI) of 45.0 to 49.9 in adult [facility]) 01/27/2019  Routine health maintenance 01/27/2019   Past Medical History:  Diagnosis Date   Allergy    Asthma    pollen season   Gout    History of kidney stones    Obese    Vitamin D deficiency    Allergies  Allergen Reactions   Kiwi Extract Anaphylaxis   Medications: Outpatient Medications Prior to Visit  Medication Sig   albuterol  (PROVENTIL) (2.5 MG/3ML) 0.083% nebulizer solution Take 2.5 mg by nebulization every 6 (six) hours as needed for wheezing or shortness of breath.   baclofen (LIORESAL) 10 MG tablet Take 1 tablet (10 mg total) by mouth 3 (three) times daily. Will cause drowsiness.   fluticasone (FLOVENT HFA) 110 MCG/ACT inhaler Inhale into the lungs 2 (two) times daily.   hydrochlorothiazide (HYDRODIURIL) 25 MG tablet Take 1 tablet (25 mg total) by mouth daily.   levothyroxine (SYNTHROID) 50 MCG tablet Take 1 tablet (50 mcg total) by mouth daily before breakfast.   naproxen (NAPROSYN) 500 MG tablet Take 1 tablet (500 mg total) by mouth 2 (two) times daily.   Vitamin D, Ergocalciferol, (DRISDOL) 1.25 MG (50000 UNIT) CAPS capsule Take 1 capsule (50,000 Units total) by mouth every 7 (seven) days. (taking one tablet per week) walk in lab in office 1-2 weeks after completing prescription.   No facility-administered medications prior to visit.    Review of Systems  Constitutional: Negative.   HENT: Negative.    Respiratory: Negative.    Cardiovascular: Negative.   Musculoskeletal:        Pain in right shoulder, right elbow and left ankle.   Last CBC Lab Results  Component Value Date   WBC 8.2 03/28/2020   HGB 15.0 03/28/2020   HCT 44.7 03/28/2020   MCV 88 03/28/2020   MCH 29.5 03/28/2020   RDW 13.4 03/28/2020   PLT 304 03/28/2020   Last metabolic panel Lab Results  Component Value Date   GLUCOSE 106 (H) 03/28/2020   NA 141 03/28/2020   K 4.9 03/28/2020   CL 100 03/28/2020   CO2 24 03/28/2020   BUN 7 03/28/2020   CREATININE 0.77 03/28/2020   GFRNONAA 108 02/27/2020   GFRAA 125 02/27/2020   CALCIUM 9.1 03/28/2020   PROT 6.7 03/28/2020   ALBUMIN 4.2 03/28/2020   LABGLOB 2.5 03/28/2020   AGRATIO 1.7 03/28/2020   BILITOT 0.2 03/28/2020   ALKPHOS 80 03/28/2020   AST 22 03/28/2020   ALT 32 03/28/2020   ANIONGAP 10 09/11/2019   Last lipids Lab Results  Component Value Date   CHOL 183  03/28/2020   HDL 30 (L) 03/28/2020   LDLCALC 96 03/28/2020   TRIG 336 (H) 03/28/2020   Last hemoglobin A1c Lab Results  Component Value Date   HGBA1C 5.7 (H) 03/28/2020   Last thyroid functions Lab Results  Component Value Date   TSH 2.230 03/28/2020   Last vitamin D Lab Results  Component Value Date   VD25OH 17.2 (L) 03/28/2020   Last vitamin B12 and Folate No results found for: VITAMINB12, FOLATE     Objective  -------------------------------------------------------------------------------------------------------------------- BP (!) 161/100 (BP Location: Right Wrist, Patient Position: Sitting, Cuff Size: Normal)   Pulse 93   Temp 98.4 F (36.9 C) (Oral)   Ht 5\' 10"  (1.778 m)   Wt (!) 375 lb (170.1 kg)   SpO2 100%   BMI 53.81 kg/m  BP Readings from Last 3 Encounters:  03/19/20 (!) 129/91  02/27/20 (!) 146/100  09/11/19 (!) 161/100   09/13/19  Readings from Last 3 Encounters:  03/19/20 (!) 341 lb 3.2 oz (154.8 kg)  02/27/20 (!) 341 lb 12.8 oz (155 kg)  09/11/19 (!) 382 lb (173.3 kg)    Physical Exam Constitutional:      General: He is not in acute distress.    Appearance: He is well-developed. He is obese.  HENT:     Head: Normocephalic and atraumatic.     Right Ear: Hearing normal.     Left Ear: Hearing normal.     Nose: Nose normal.  Eyes:     General: Lids are normal. No scleral icterus.       Right eye: No discharge.        Left eye: No discharge.     Extraocular Movements: Extraocular movements intact.     Conjunctiva/sclera: Conjunctivae normal.  Cardiovascular:     Rate and Rhythm: Normal rate and regular rhythm.     Heart sounds: Normal heart sounds.  Pulmonary:     Effort: Pulmonary effort is normal. No respiratory distress.  Abdominal:     General: Bowel sounds are normal.  Musculoskeletal:        General: Normal range of motion.     Cervical back: Normal range of motion and neck supple.     Comments: Tender anterior right shoulder to palpate  and test lateral strength. Pain in the right lateral epicondyle  Skin:    Findings: No lesion or rash.  Neurological:     Mental Status: He is alert and oriented to person, place, and time.  Psychiatric:        Speech: Speech normal.        Behavior: Behavior normal.        Thought Content: Thought content normal.      No results found for any visits on 09/12/20.  Assessment & Plan  ---------------------------------------------------------------------------------------------------------------------- 1. Chronic right shoulder pain Several weeks of right shoulder pain to reach overhead or behind back. No specific injury reported. Frequently sleeps on the right shoulder. May use Ibuprofen or Aleve for inflammation and discomfort. Will check x-rays and if needed schedule orthopedic referral. - CBC with Differential/Platelet - DG Shoulder Right  2. Lateral epicondylitis, right elbow Works in repetitive motion type job and pain over the lateral elbow epicondyle. May use Ibuprofen or Aleve and use a Tennis Elbow strap. Check for signs of infection and get x-ray evaluation. May need orthopedic referral for possible cortisone injection - CBC with Differential/Platelet - DG Elbow Complete Right  3. Left ankle pain, unspecified chronicity Left ankle pain the past several weeks without a specific injury. Reports a history of gout but does not feel it is as painful as past attacks. Will check labs and get x-ray evaluation of joints. - CBC with Differential/Platelet - Uric acid - DG Ankle Complete Left  4. Hypothyroidism, unspecified type Has gained 35 lbs in the past 6 months. Refill Levothyroxine and adjust pending lab reports. - levothyroxine (SYNTHROID) 50 MCG tablet; Take 1 tablet (50 mcg total) by mouth daily before breakfast.  Dispense: 90 tablet; Refill: 0 - T4 - TSH - Comprehensive metabolic panel  5. Primary hypertension BP high and needs to get back on the HCTZ regularly. Refilled  medication and chec follow up labs. - hydrochlorothiazide (HYDRODIURIL) 25 MG tablet; Take 1 tablet (25 mg total) by mouth daily.  Dispense: 90 tablet; Refill: 0 - TSH - Comprehensive metabolic panel  6. Class 3 severe obesity due to excess calories with body mass index (BMI)  of 50.0 to 59.9 in adult, unspecified whether serious comorbidity present (HCC) Counseled regarding need for weight loss to help control hypertension. Encouraged to exercise as joint issues allow. Check labs for signs of other metabolic disorders. - TSH - Hemoglobin A1c - Comprehensive metabolic panel    No follow-ups on file.      I, Joleigh Mineau, PA-C, have reviewed all documentation for this visit. The documentation on 09/12/20 for the exam, diagnosis, procedures, and orders are all accurate and complete.    Dortha Kernennis Alice Burnside, PA-C  [facility] [phone] (phone) [phone] (fax)  [facility]

## 2020-10-01 ENCOUNTER — Ambulatory Visit: Payer: Managed Care, Other (non HMO) | Admitting: Family Medicine

## 2021-04-11 ENCOUNTER — Ambulatory Visit: Payer: Managed Care, Other (non HMO) | Admitting: Physician Assistant

## 2021-04-11 ENCOUNTER — Encounter: Payer: Self-pay | Admitting: Physician Assistant

## 2021-04-11 VITALS — BP 142/88 | HR 80 | Temp 97.5°F | Resp 16 | Ht 69.0 in | Wt 376.9 lb

## 2021-04-11 DIAGNOSIS — M25531 Pain in right wrist: Secondary | ICD-10-CM

## 2021-04-11 NOTE — Progress Notes (Signed)
?  ? ? ?Established patient visit ? ? ?Patient: Cameron Thompson   DOB: 03/24/1980   41 y.o. Male  MRN: [id number] ?Visit Date: 04/11/2021 ? ?Today's healthcare provider: Debera Lat, PA-C  ? ?CC: wrist pain ? ?Subjective  ?  ?Wrist Pain ? ?He reports new onset right wrist pain. Episode was not an injury that may have caused the pain. The pain started yesterday and is gradually worsening. The pain does not radiate. The pain is described as burning and pinching, sometimes wet and cold. is moderate in intensity however severe last night, occurring constantly. Symptoms are worse in the: nighttime / pt works night at [employer]. ?Aggravating factors: repetitive motion   Relieving factors: none  ?He has tried application of ice and Aleve with no relief.  ?Denies having trauma, injury, hx of gout, RA, psoriasis.  Denies having repeated flares of pain and swelling in both hands or wrists ?    Denies having fevers, night sweats, chills, malaise, weight loss, or chronic fatigue, or bilateral wrist symptoms.  ? 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 ?  ?  ?Medications: ?Outpatient Medications Prior to Visit  ?Medication Sig  ? albuterol (PROVENTIL) (2.5 MG/3ML) 0.083% nebulizer solution Take 2.5 mg by nebulization every 6 (six) hours as needed for wheezing or shortness of breath.  ? naproxen (NAPROSYN) 500 MG tablet Take 1 tablet (500 mg total) by mouth 2 (two) times daily.  ? baclofen (LIORESAL) 10 MG tablet Take 1 tablet (10 mg total) by mouth 3 (three) times daily. Will cause drowsiness.  ? fluticasone (FLOVENT HFA) 110 MCG/ACT inhaler Inhale into the lungs 2 (two) times daily.  ? hydrochlorothiazide (HYDRODIURIL) 25 MG tablet Take 1 tablet (25 mg total) by mouth daily.  ? levothyroxine (SYNTHROID) 50 MCG tablet Take 1 tablet (50 mcg total) by mouth daily before breakfast.  ? Vitamin D, Ergocalciferol, (DRISDOL) 1.25 MG (50000 UNIT) CAPS capsule Take 1 capsule  (50,000 Units total) by mouth every 7 (seven) days. (taking one tablet per week) walk in lab in office 1-2 weeks after completing prescription.  ? ?No facility-administered medications prior to visit.  ? ? ?Review of Systems  ?Constitutional: Negative.   ?Respiratory: Negative.    ?Cardiovascular: Negative.   ?Gastrointestinal: Negative.   ?Musculoskeletal:  Positive for arthralgias.  ?Skin: Negative.   ? ?  Objective  ?  ?BP (!) 142/88 (BP Location: Left Arm, Patient Position: Sitting, Cuff Size: Large)   Pulse 80   Temp (!) 97.5 ?F (36.4 ?C) (Oral)   Resp 16   Ht 5\' 9"  (1.753 m)   Wt (!) 376 lb 14.4 oz (171 kg)   SpO2 98%   BMI 55.66 kg/m?  ? ? ?Physical Exam ?Vitals and nursing note reviewed.  ?Constitutional:   ?   Appearance: He is obese.  ?HENT:  ?   Head: Normocephalic and atraumatic.  ?Cardiovascular:  ?   Rate and Rhythm: Normal rate.  ?   Pulses: Normal pulses.  ?Pulmonary:  ?   Effort: Pulmonary effort is normal.  ?Musculoskeletal:     ?   General: Tenderness (right writst) present. No swelling.  ?   Comments: Joint exam: ?NO bruising, erythema, deformity, lacerations on inspection ?Areas of tenderness on palpation over radial and ulnar aspects of the right wrist ?In the right wrist , range of motion restricted with flexion, extension due to pain  ?Special tests were attempted but could not be perform  due to acute pain ?  ?Neurological:  ?   General: No focal deficit present.  ?   Mental Status: He is alert and oriented to person, place, and time.  ?Psychiatric:     ?   Behavior: Behavior normal.     ?   Thought Content: Thought content normal.     ?   Judgment: Judgment normal.  ?  ? ? Assessment & Plan  ?  ? ?1. Right wrist pain ? ?Acute-on-chronic ?Nontraumatic, 2/2 overuse ?Due to carpal tunnel syndrome vs ulnar neuropathy/tendinopathy vs dequervains tenosynovitis vs OA ? ?- OTC pain medications PRN, topical NSAIDs ?- Contrast baths, ice and braces/splint. Patient was instructed. ?- Gentle  stretching exercises. Patient prefers not to start on PT at this point. ?- Ergonomic intervention at work place were discussed. ?- Fu in 1-3 mo for chronic conditions. ?  ?The patient was advised to call back or seek an in-person evaluation if the symptoms worsen or if the condition fails to improve as anticipated. ? ?I discussed the assessment and treatment plan with the patient. The patient was provided an opportunity to ask questions and all were answered. The patient agreed with the plan and demonstrated an understanding of the instructions. ? ?The entirety of the information documented in the History of Present Illness, Review of Systems and Physical Exam were personally obtained by me. Portions of this information were initially documented by the CMA and reviewed by me for thoroughness and accuracy.   ? ? ?I,Mekhia Brogan Robinson,acting as a Neurosurgeon for [employer], PA-C.,have documented all relevant documentation on the behalf of Debera Lat, PA-C,as directed by  [employer], PA-C while in the presence of [employer], PA-C. ? ?Debera Lat, PA-C  ?[facility] ?[phone] (phone) ?[phone] (fax) ? ?[facility] ?

## 2021-04-17 NOTE — Progress Notes (Signed)
?  ? ?I,Corrinne Benegas Robinson,acting as a Neurosurgeon for [employer], PA-C.,have documented all relevant documentation on the behalf of Debera Lat, PA-C,as directed by  [employer], PA-C while in the presence of [employer], PA-C. ? ? ?Established patient visit ? ? ?Patient: Cameron Thompson   DOB: 01/16/1980   41 y.o. Male  MRN: [id number] ?Visit Date: 04/18/2021 ? ?Today's healthcare provider: Debera Lat, PA-C  ? ?Chief Complaint  ?Patient presents with  ? Wrist Pain  ? ?Subjective  ?  ?Patient presents for f/u right wrist pain.  Last seen 04-11-21 recommendations OTC pain med, topical NSAIDS, contrast baths, ice and braces. Pain started in left wrist as well. ?Patient reports having almost no pain outside work and worsening of pain at work with repetitive movements. ?Denies having numbness or other sensory abnormalities . 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 ? ?Medications: ?Outpatient Medications Prior to Visit  ?Medication Sig  ? albuterol (PROVENTIL) (2.5 MG/3ML) 0.083% nebulizer solution Take 2.5 mg by nebulization every 6 (six) hours as needed for wheezing or shortness of breath.  ? naproxen (NAPROSYN) 500 MG tablet Take 1 tablet (500 mg total) by mouth 2 (two) times daily.  ? ?No facility-administered medications prior to visit.  ? ? ?Review of Systems  ?Constitutional: Negative.   ?Respiratory: Negative.    ?Cardiovascular: Negative.   ?Musculoskeletal:  Positive for arthralgias (with repetitive movements).  ?Psychiatric/Behavioral:  Negative for sleep disturbance.   ? ?  Objective  ?  ?BP 136/84 (BP Location: Right Arm, Patient Position: Sitting, Cuff Size: Large)   Pulse 88   Temp 97.7 ?F (36.5 ?C) (Oral)   Resp 16   Wt (!) 377 lb 3.2 oz (171.1 kg)   SpO2 98%   BMI 55.70 kg/m?  ? ? ?Physical Exam ?Constitutional:   ?   Appearance: Normal appearance. He is obese.  ?HENT:  ?   Head: Normocephalic and atraumatic.  ?Musculoskeletal:     ?   General:  Tenderness (mild) present. No swelling.  ?   Comments: Tinel and Phalen's test negative.Some discomfort over the right radial aspects of forearm and wrist, volar aspect of the right wrist.  ?Skin: ?   General: Skin is warm.  ?   Capillary Refill: Capillary refill takes less than 2 seconds.  ?   Findings: No erythema.  ?Neurological:  ?   General: No focal deficit present.  ?   Mental Status: He is alert and oriented to person, place, and time.  ?   Sensory: No sensory deficit.  ?   Motor: No weakness.  ?Psychiatric:     ?   Behavior: Behavior normal.     ?   Thought Content: Thought content normal.     ?   Judgment: Judgment normal.  ?  ? ? ? Assessment & Plan  ?  ? ?1.Pain in both wrists ?Due to radial neuropathy vs carpal tunnel vs tendinitis vs tendosynovitis vs RSI ?- Ambulatory referral to Physical Therapy ?- a trial of gabapentin (NEURONTIN) 100 MG capsule; Take 1 capsule (100 mg total) by mouth 3 (three) times daily.  Dispense: 90 capsule; Refill: 3 Start to take at bedtime for proper sleep and persistent pain ?- OTC topical pain cream. Avoid activities that makes pain. Ergonomically reorganize his current work. Continue to use braces/ Use OTC pain medication. Continue compression, heat/ice as needed. ?- At this point, patient prefers not to see ortho. ?- Fu in 1-3  mo for chronic conditions. ? ?The patient was advised to call back or seek an in-person evaluation if the symptoms worsen or if the condition fails to improve as anticipated. ? ?I discussed the assessment and treatment plan with the patient. The patient was provided an opportunity to ask questions and all were answered. The patient agreed with the plan and demonstrated an understanding of the instructions. ? ?The entirety of the information documented in the History of Present Illness, Review of Systems and Physical Exam were personally obtained by me. Portions of this information were initially documented by the CMA and reviewed by me for  thoroughness and accuracy.   ?   ? ?Debera Lat, PA-C  ?[facility] ?[phone] (phone) ?[phone] (fax) ? ?[facility] ?

## 2021-04-18 ENCOUNTER — Ambulatory Visit: Payer: Managed Care, Other (non HMO) | Admitting: Physician Assistant

## 2021-04-18 ENCOUNTER — Encounter: Payer: Self-pay | Admitting: Physician Assistant

## 2021-04-18 VITALS — BP 136/84 | HR 88 | Temp 97.7°F | Resp 16 | Wt 377.2 lb

## 2021-04-18 DIAGNOSIS — M25532 Pain in left wrist: Secondary | ICD-10-CM

## 2021-04-18 DIAGNOSIS — M25531 Pain in right wrist: Secondary | ICD-10-CM | POA: Diagnosis not present

## 2021-04-18 MED ORDER — GABAPENTIN 100 MG PO CAPS: 100.0000 mg | ORAL_CAPSULE | Freq: Three times a day (TID) | ORAL | 3 refills | Status: DC

## 2021-05-15 NOTE — Progress Notes (Deleted)
     I,Jana Bentleigh Stankus,acting as a Education administrator for [employer], PA-C.,have documented all relevant documentation on the behalf of Mardene Speak, PA-C,as directed by  Mardene Speak, PA-C while in the presence of Mardene Speak, PA-C.'   Established patient visit   Patient: Cameron Thompson   DOB: Jan 07, 1981   41 y.o. Male  MRN: [id number] Visit Date: 05/16/2021  Today's healthcare provider: Mardene Speak, PA-C   No chief complaint on file. CC: f/u bilateral wrist pain Subjective    HPI  Follow up for wrist pain  The patient was last seen for this 1 months ago. Changes made at last visit include Ambulatory referral to Physical Therapy - a trial of gabapentin:start to take at bedtime for proper sleep and persistent pain . - OTC topical pain cream, continue to use braces, OTC pain medication, compression, heat/ice as needed.  He reports {excellent/good/fair/poor:19665} compliance with treatment. He feels that condition is {improved/worse/unchanged:3041574}. He {is/is not:21021397} having side effects. ***  -----------------------------------------------------------------------------------------   Medications: Outpatient Medications Prior to Visit  Medication Sig   albuterol (PROVENTIL) (2.5 MG/3ML) 0.083% nebulizer solution Take 2.5 mg by nebulization every 6 (six) hours as needed for wheezing or shortness of breath.   gabapentin (NEURONTIN) 100 MG capsule Take 1 capsule (100 mg total) by mouth 3 (three) times daily.   naproxen (NAPROSYN) 500 MG tablet Take 1 tablet (500 mg total) by mouth 2 (two) times daily.   No facility-administered medications prior to visit.    Review of Systems  {Labs  Heme  Chem  Endocrine  Serology  Results Review (optional):23779}   Objective    There were no vitals taken for this visit. {Show previous vital signs (optional):23777}  Physical Exam  ***  No results found for any visits on 05/16/21.  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  Dispense: 90 capsule; Refill: 3 Start to take at bedtime for proper sleep and persistent pain - OTC topical pain cream. Avoid activities that makes pain. Ergonomically reorganize his current work. Continue to use braces/ Use OTC pain medication. Continue compression, heat/ice as needed. - At this point, patient prefers not to see ortho. - Fu in 1-3 mo for chronic conditions.  No follow-ups on file.      {provider attestation***:1}   Mardene Speak, Hershal Coria  [facility] [phone] (phone) [phone] (fax)  [place]

## 2021-05-16 ENCOUNTER — Other Ambulatory Visit: Payer: Self-pay | Admitting: Physician Assistant

## 2021-05-16 ENCOUNTER — Ambulatory Visit: Payer: Managed Care, Other (non HMO) | Admitting: Physician Assistant

## 2021-05-16 DIAGNOSIS — M25531 Pain in right wrist: Secondary | ICD-10-CM

## 2021-05-16 NOTE — Telephone Encounter (Signed)
Copied from CRM [id number]. Topic: General - Other ?>> May 16, 2021  4:05 PM Gaetana Michaelis A wrote: ?Reason for CRM: Medication Refill - Medication: gabapentin (NEURONTIN) 100 MG capsule [[id number]]  ? ?Has the patient contacted their pharmacy? Yes.  The patient was directed to contact their PCP ?(Agent: If no, request that the patient contact the pharmacy for the refill. If patient does not wish to contact the pharmacy document the reason why and proceed with request.) ?(Agent: If yes, when and what did the pharmacy advise?) ? ?Preferred Pharmacy (with phone number or street name): [facility] [address] [place], [place] - [address] ?[address] [place] [address] ?Phone: [phone] Fax: [phone] ?Hours: Not open 24 hours ? ? ?Has the patient been seen for an appointment in the last year OR does the patient have an upcoming appointment? Yes.   ? ?Agent: Please be advised that RX refills may take up to 3 business days. We ask that you follow-up with your pharmacy. ?

## 2021-05-16 NOTE — Telephone Encounter (Signed)
Requested medication (s) are due for refill today: no ? ?Requested medication (s) are on the active medication list: yes ? ?Last refill:  04/18/21 #90 3 refills ? ?Future visit scheduled: yes in 4 weeks ? ?Notes to clinic:  patient requesting further refills. Do you want to refill Rx? ? ? ?  ?Requested Prescriptions  ?Pending Prescriptions Disp Refills  ? gabapentin (NEURONTIN) 100 MG capsule 90 capsule 3  ?  Sig: Take 1 capsule (100 mg total) by mouth 3 (three) times daily.  ?  ? Neurology: Anticonvulsants - gabapentin Failed - 05/16/2021  4:45 PM  ?  ?  Failed - Cr in normal range and within 360 days  ?  Creatinine, Ser  ?Date Value Ref Range Status  ?03/28/2020 0.77 0.76 - 1.27 mg/dL Final  ?  ?  ?  ?  Passed - Completed PHQ-2 or PHQ-9 in the last 360 days  ?  ?  Passed - Valid encounter within last 12 months  ?  Recent Outpatient Visits   ? ?      ? 4 weeks ago Pain in both wrists  ? [facility] [place], [place], [place]  ? 1 month ago Right wrist pain  ? [facility] [place], [place], [place]  ? 8 months ago Chronic right shoulder pain  ? [facility] Chrismon, Jodell Cipro, PA-C  ? 1 year ago Frequent headaches  ? [facility] Flinchum, Eula Fried, FNP  ? 1 year ago Body mass index (BMI) of 50-59.9 in adult [facility])  ? [facility] Flinchum, Eula Fried, FNP  ? ?  ?  ?Future Appointments   ? ?        ? In 4 weeks Debera Lat, PA-C [facility]  ? ?  ? ? ?  ?  ?  ? ?

## 2021-06-13 ENCOUNTER — Ambulatory Visit: Payer: Managed Care, Other (non HMO) | Admitting: Physician Assistant

## 2021-06-13 NOTE — Progress Notes (Deleted)
     I,Jana Antonyo Hinderer,acting as a Neurosurgeon for [employer], PA-C.,have documented all relevant documentation on the behalf of Debera Lat, PA-C,as directed by  [employer], PA-C while in the presence of [employer], PA-C.   Established patient visit   Patient: Cameron Thompson   DOB: [birth year]   41 y.o. Male  MRN: [id number] Visit Date: 06/13/2021  Today's healthcare provider: Debera Lat, PA-C   No chief complaint on file. CC: follow up wrist pain Subjective    Cameron Thompson is a 41 yr old male presenting for follow up bilateral wrist pain.   Follow up for wrist pain  The patient was last seen for this 2 months ago. Changes made at last visit include referral PT, trial of Gabapentin 100 mg 1capsule 3 times daily start at bedtime for proper sleep and persistent pain, OTC topical pain cream, Avoid activities that makes pain worse. Ergonomically reorganize his current work. Continue to use braces/ Use OTC pain medication. Continue compression, heat/ice as needed He reports {excellent/good/fair/poor:19665} compliance with treatment. He feels that condition is {improved/worse/unchanged:3041574}. He {is/is not:21021397} having side effects. ***  -----------------------------------------------------------------------------------------       Medications: Outpatient Medications Prior to Visit  Medication Sig   albuterol (PROVENTIL) (2.5 MG/3ML) 0.083% nebulizer solution Take 2.5 mg by nebulization every 6 (six) hours as needed for wheezing or shortness of breath.   gabapentin (NEURONTIN) 100 MG capsule Take 1 capsule (100 mg total) by mouth 3 (three) times daily.   naproxen (NAPROSYN) 500 MG tablet Take 1 tablet (500 mg total) by mouth 2 (two) times daily.   No facility-administered medications prior to visit.    Review of Systems  {Labs  Heme  Chem  Endocrine  Serology  Results Review (optional):23779}   Objective    There were no vitals taken for this  visit. {Show previous vital signs (optional):23777}  Physical Exam  ***  No results found for any visits on 06/13/21.  Assessment & Plan     ***  No follow-ups on file.      {provider attestation***:1}   Debera Lat, Cordelia Poche  [facility] [phone] (phone) [phone] (fax)  [facility]

## 2021-07-18 IMAGING — CR DG ABDOMEN 1V
2 series · 2 of 2 positions shown · non-contrast
Comparison: 03/14/2016

CLINICAL DATA: Chest pain

EXAM:
ABDOMEN - 1 VIEW

[abdomen kub (1 of 2)]
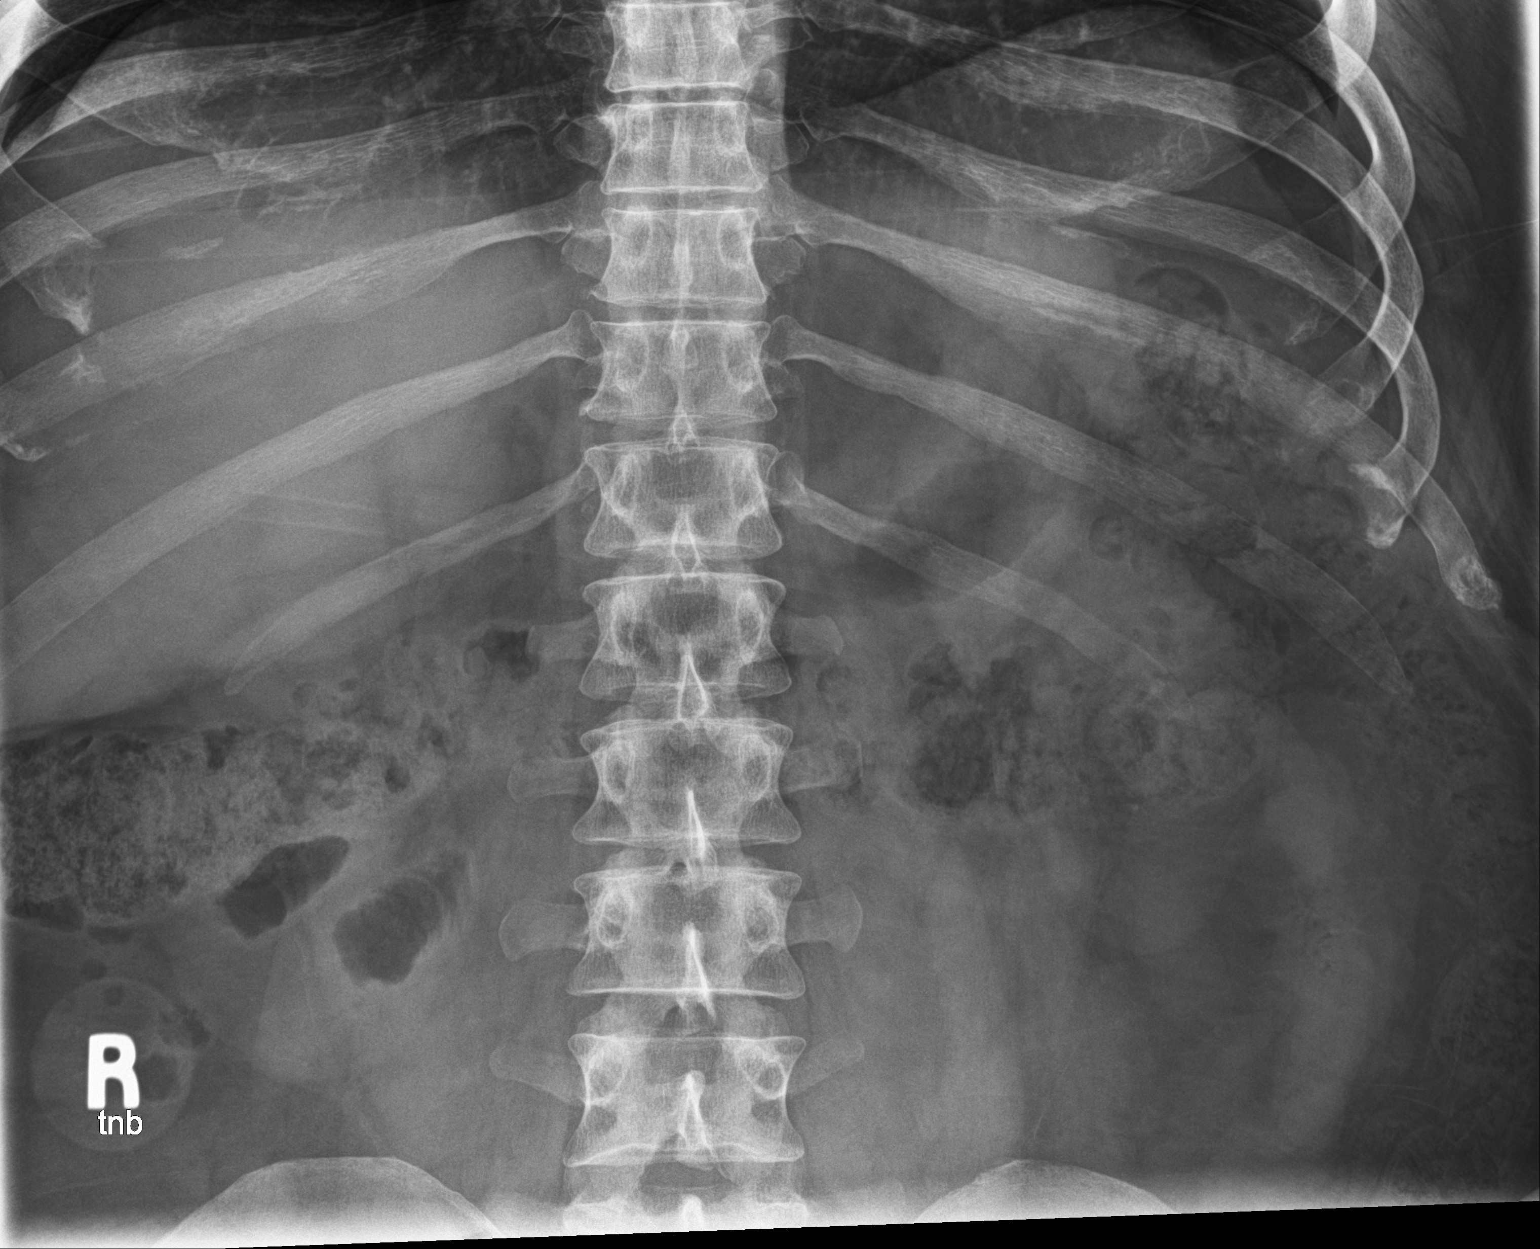

[abdomen kub (2 of 2)]
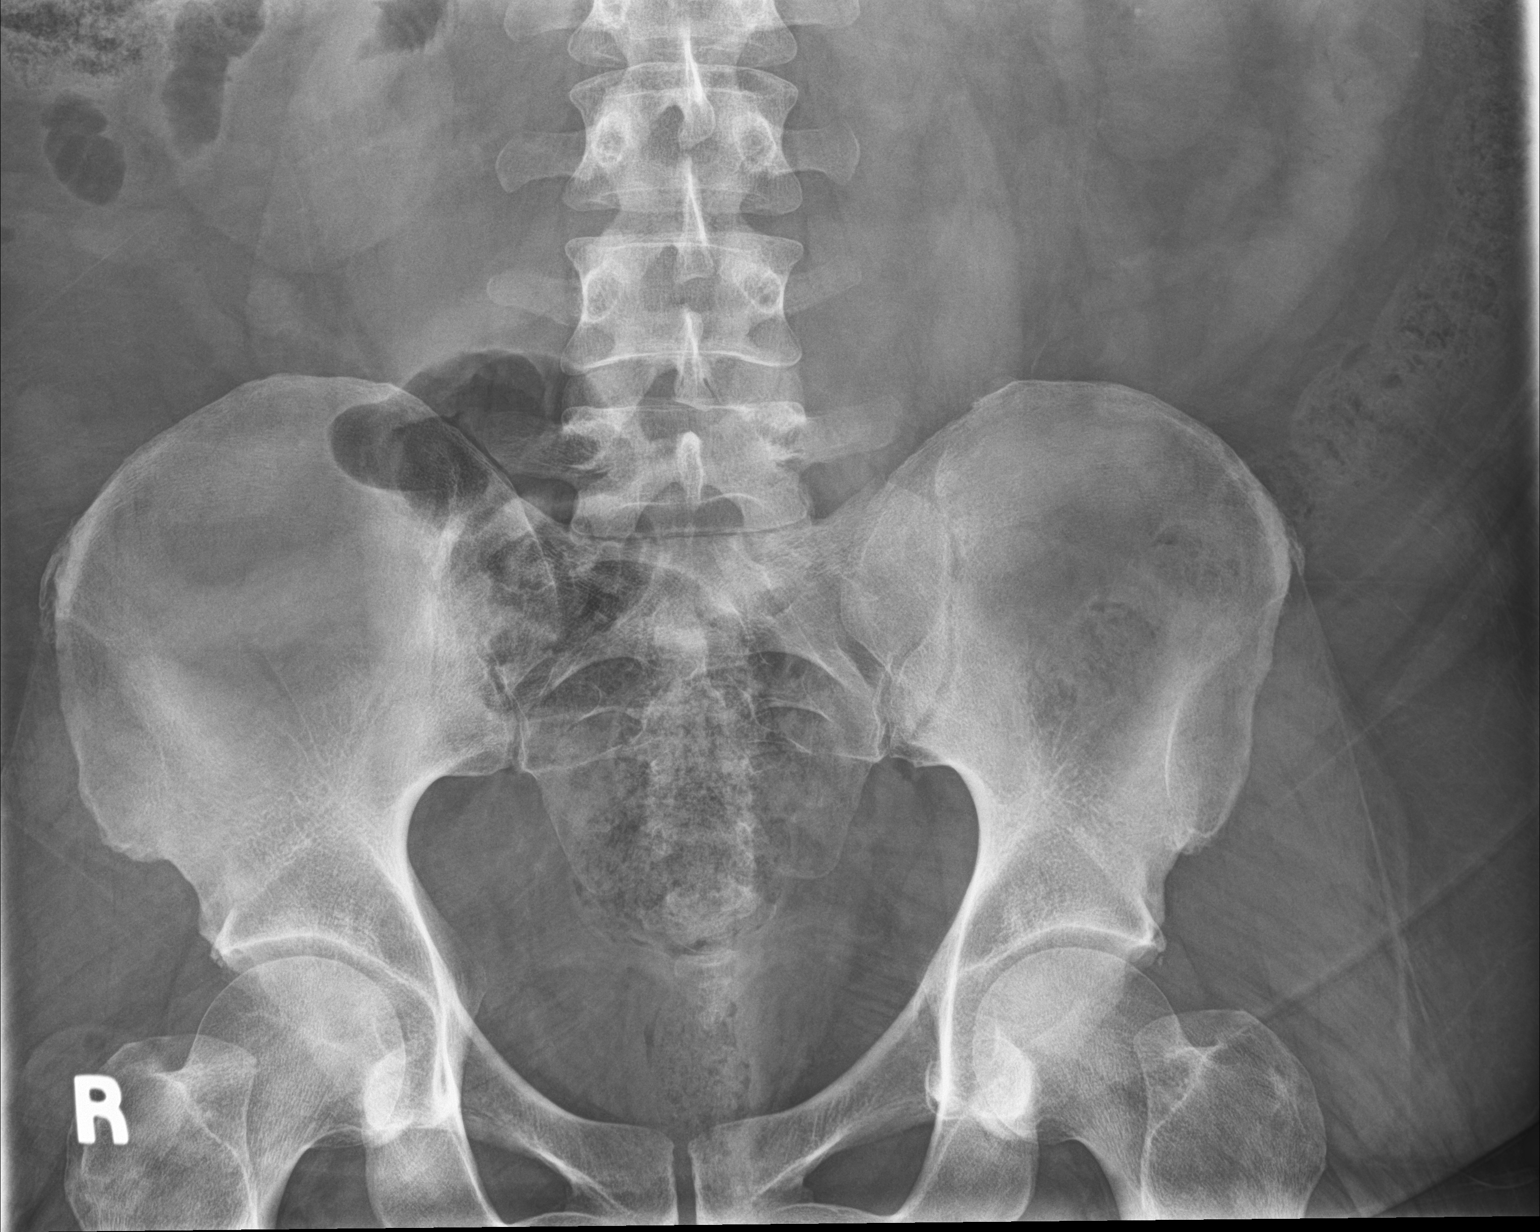

[2 of 2 positions shown; findings below may reference images not displayed]

FINDINGS: Nonobstructed gas pattern with moderate stool. Punctate stone over
the lower pole of the left kidney.
IMPRESSION: Nonobstructed gas pattern with moderate stool. Punctate left kidney
stone.

## 2021-07-18 IMAGING — CR DG CHEST 2V
3 series · 3 of 3 positions shown · non-contrast
Comparison: None.

CLINICAL DATA: Chest pain

EXAM:
CHEST - 2 VIEW

[chest pa]
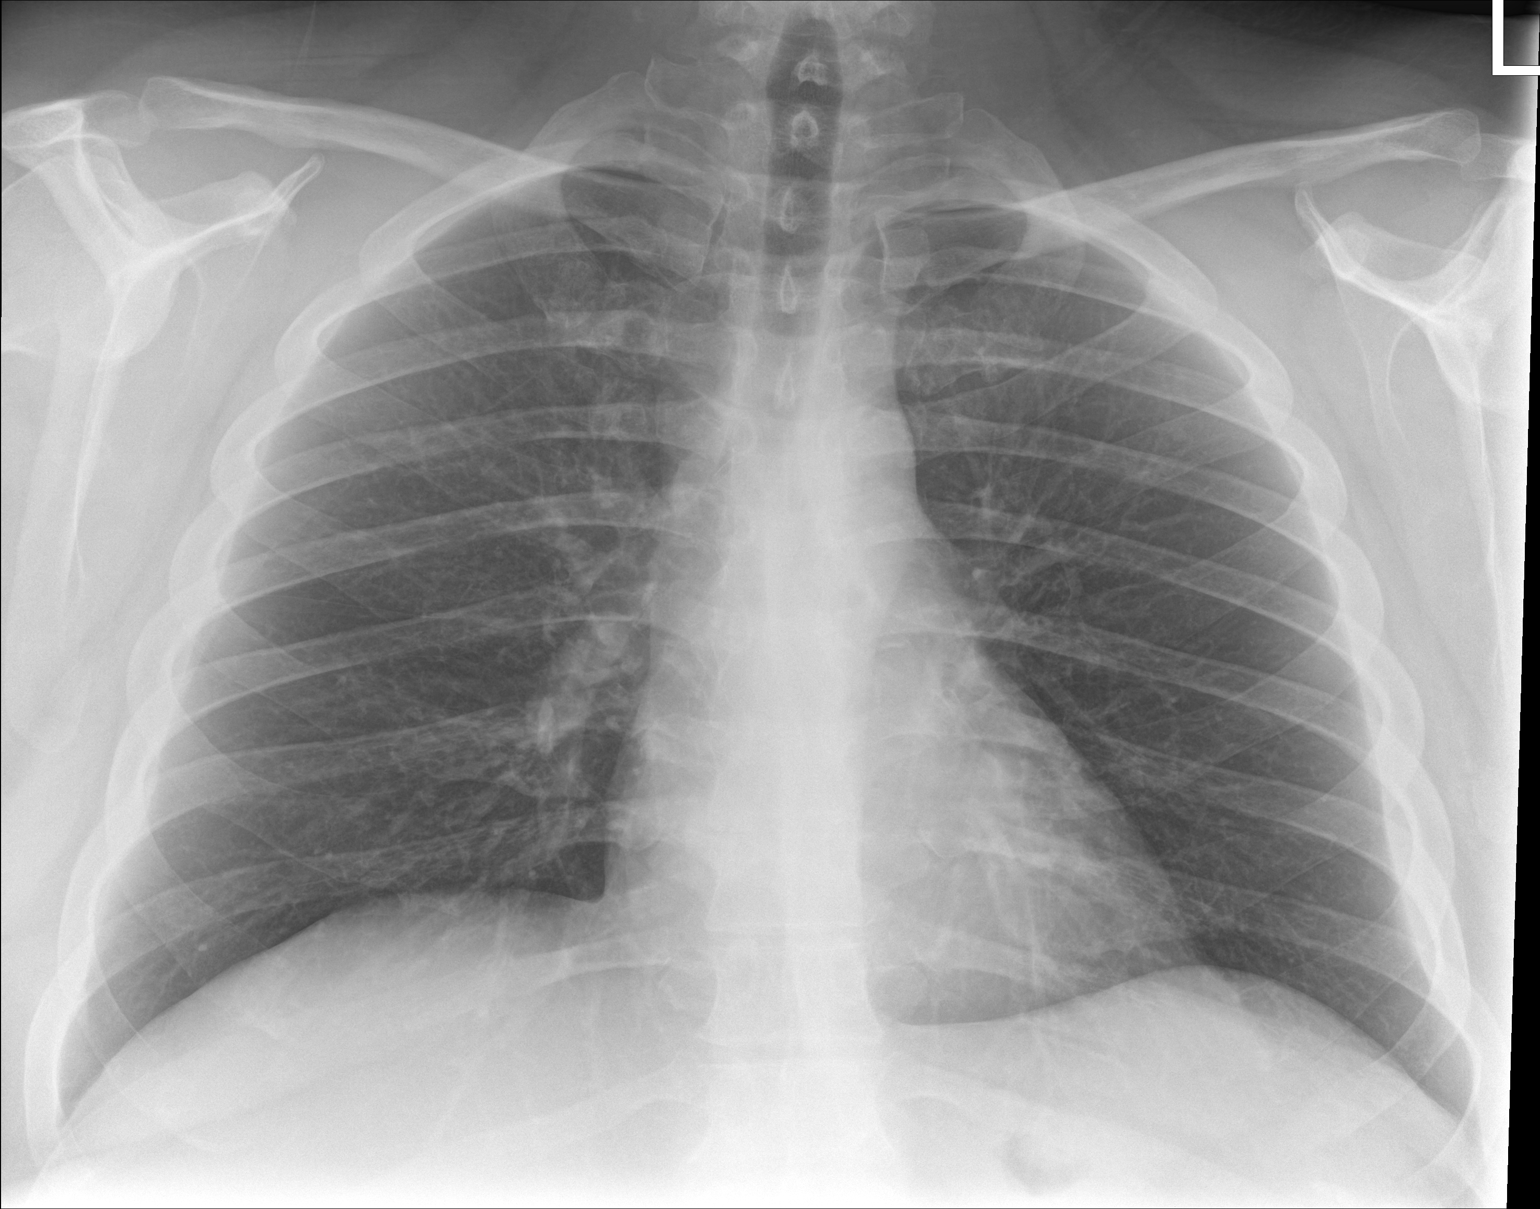

[chest lat (1 of 2)]
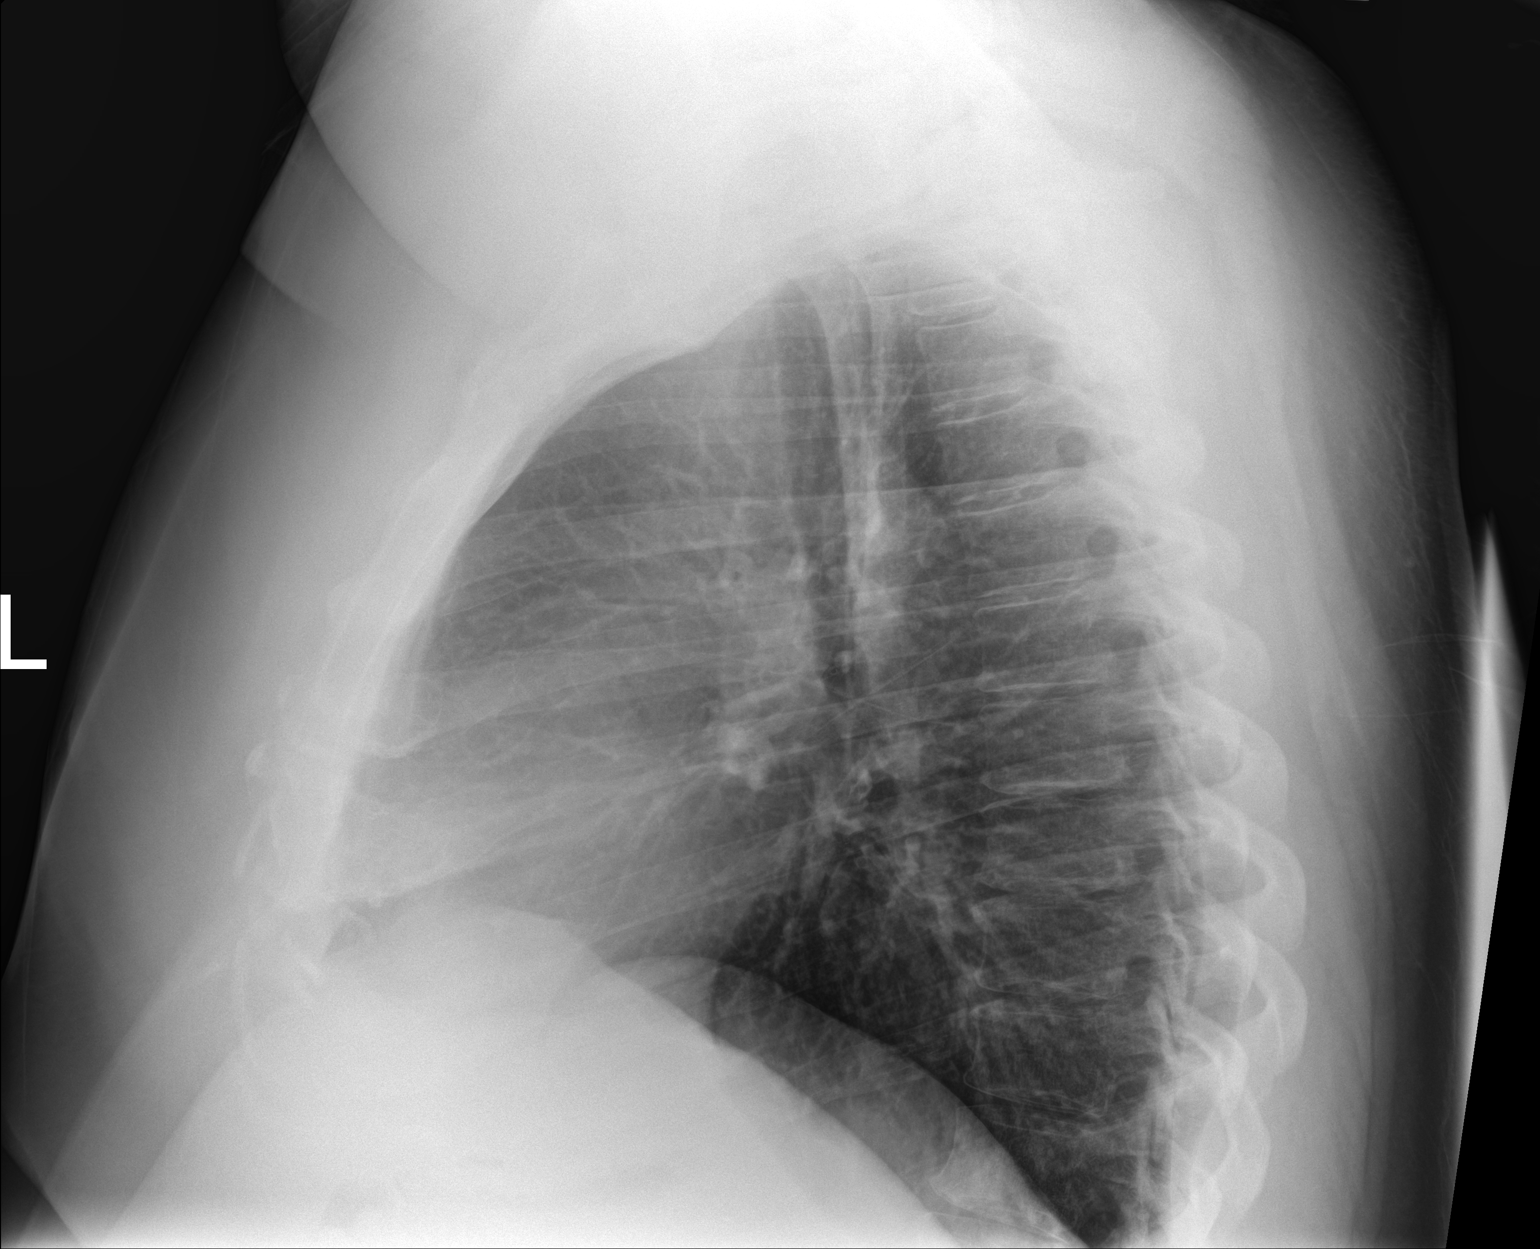

[chest lat (2 of 2)]
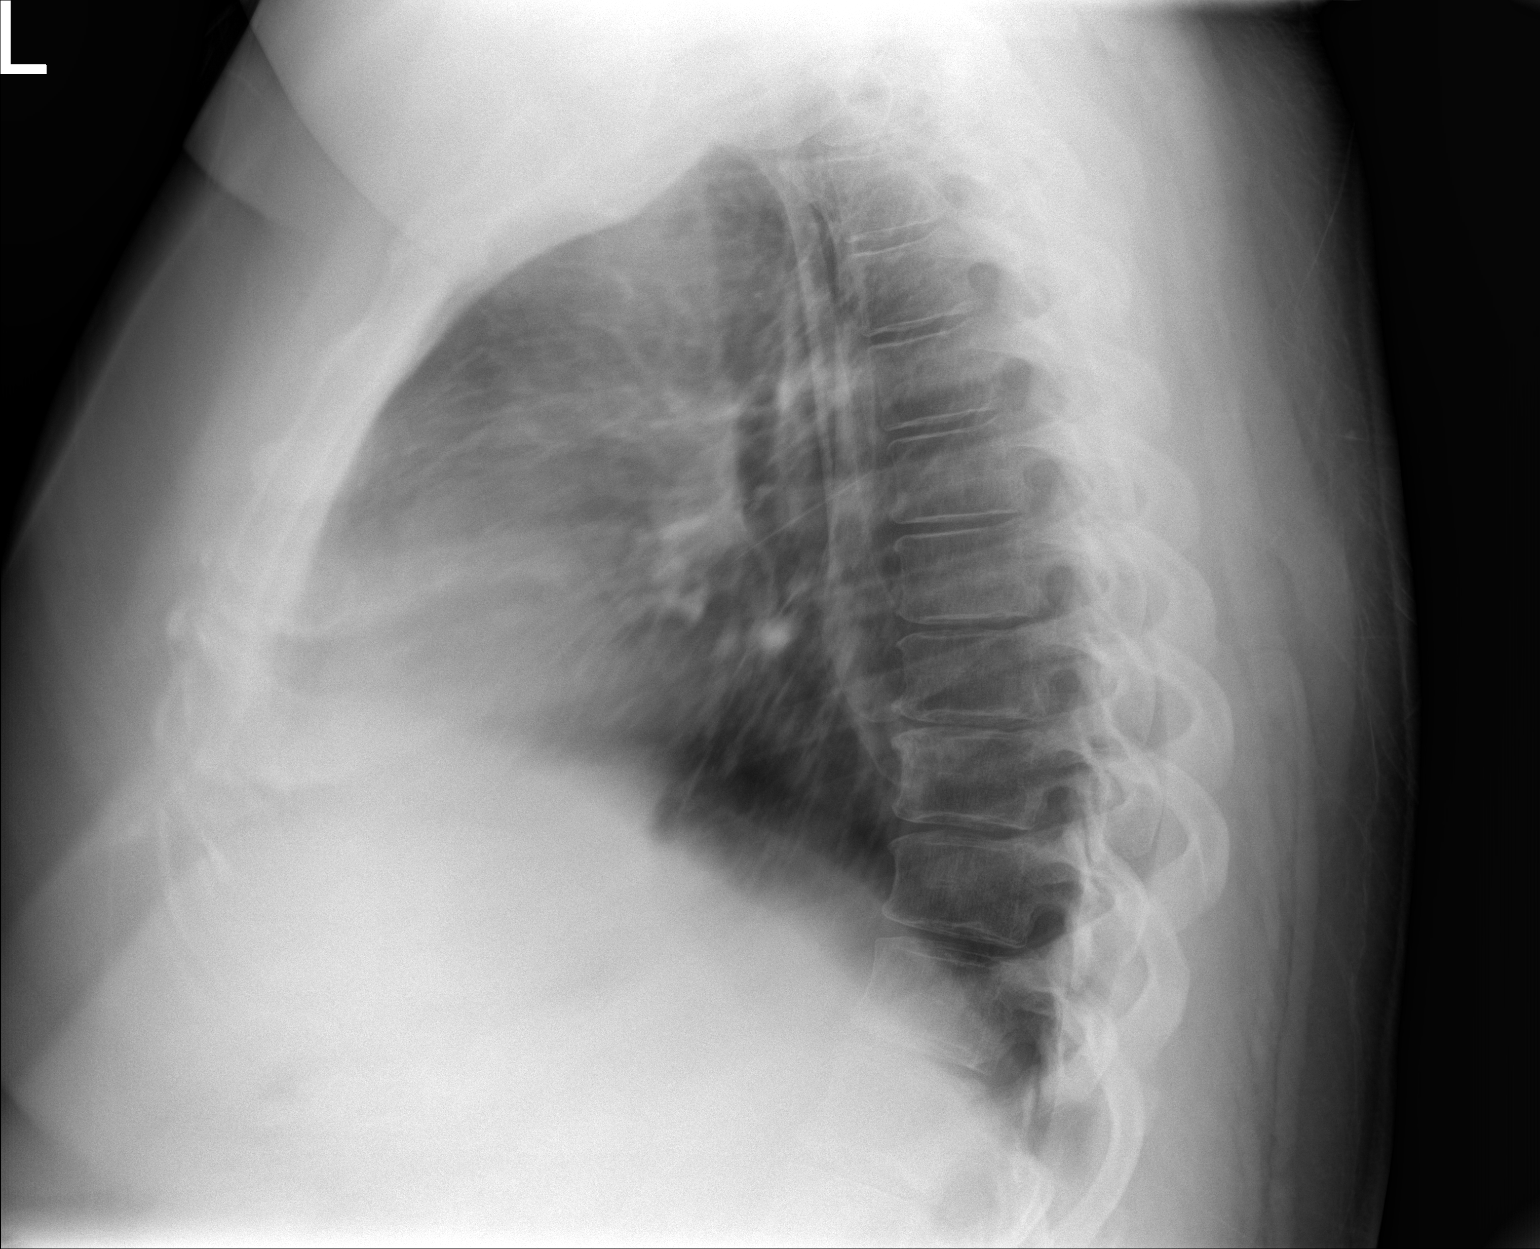

[3 of 3 positions shown; findings below may reference images not displayed]

FINDINGS: The heart size and mediastinal contours are within normal limits.
Both lungs are clear. The visualized skeletal structures are
unremarkable.
IMPRESSION: No active cardiopulmonary disease.

## 2021-12-15 ENCOUNTER — Emergency Department
Admission: EM | Admit: 2021-12-15 | Discharge: 2021-12-15 | Disposition: A | Discharge status: Home / Self Care | Payer: Managed Care, Other (non HMO) | Attending: Student in an Organized Health Care Education/Training Program | Admitting: Student in an Organized Health Care Education/Training Program

## 2021-12-15 ENCOUNTER — Other Ambulatory Visit: Payer: Managed Care, Other (non HMO)

## 2021-12-15 ENCOUNTER — Other Ambulatory Visit: Payer: Self-pay

## 2021-12-15 ENCOUNTER — Emergency Department: Payer: Managed Care, Other (non HMO)

## 2021-12-15 DIAGNOSIS — Z7951 Long term (current) use of inhaled steroids: Secondary | ICD-10-CM | POA: Insufficient documentation

## 2021-12-15 DIAGNOSIS — J0110 Acute frontal sinusitis, unspecified: Secondary | ICD-10-CM | POA: Insufficient documentation

## 2021-12-15 DIAGNOSIS — J45909 Unspecified asthma, uncomplicated: Secondary | ICD-10-CM | POA: Insufficient documentation

## 2021-12-15 DIAGNOSIS — J40 Bronchitis, not specified as acute or chronic: Secondary | ICD-10-CM | POA: Insufficient documentation

## 2021-12-15 DIAGNOSIS — R062 Wheezing: Secondary | ICD-10-CM

## 2021-12-15 DIAGNOSIS — Z1152 Encounter for screening for COVID-19: Secondary | ICD-10-CM | POA: Insufficient documentation

## 2021-12-15 LAB — RESP PANEL BY RT-PCR (FLU A&B, COVID) ARPGX2
Influenza A by PCR: NEGATIVE
Influenza B by PCR: NEGATIVE
SARS Coronavirus 2 by RT PCR: NEGATIVE

## 2021-12-15 MED ORDER — IPRATROPIUM-ALBUTEROL 0.5-2.5 (3) MG/3ML IN SOLN
3.0000 mL | Freq: Once | RESPIRATORY_TRACT | Status: AC
  Administered 2021-12-15: 3 mL via RESPIRATORY_TRACT
  Filled 2021-12-15: qty 3

## 2021-12-15 MED ORDER — DOXYCYCLINE HYCLATE 100 MG PO TABS
100.0000 mg | ORAL_TABLET | Freq: Once | ORAL | Status: AC
  Administered 2021-12-15: 100 mg via ORAL
  Filled 2021-12-15: qty 1

## 2021-12-15 MED ORDER — DOXYCYCLINE MONOHYDRATE 100 MG PO CAPS: 100.0000 mg | ORAL_CAPSULE | Freq: Two times a day (BID) | ORAL | 0 refills | Status: AC

## 2021-12-15 MED ORDER — METHYLPREDNISOLONE SODIUM SUCC 125 MG IJ SOLR
125.0000 mg | Freq: Once | INTRAMUSCULAR | Status: AC
  Administered 2021-12-15: 125 mg via INTRAMUSCULAR
  Filled 2021-12-15: qty 2

## 2021-12-15 MED ORDER — ALBUTEROL SULFATE HFA 108 (90 BASE) MCG/ACT IN AERS: 2.0000 | INHALATION_SPRAY | Freq: Four times a day (QID) | RESPIRATORY_TRACT | 0 refills | Status: DC | PRN

## 2021-12-15 NOTE — Discharge Instructions (Signed)
Please take medication as prescribed.  Use albuterol as needed for any wheezing.  Return to the ER for any worsening chest pain, shortness of breath or any urgent changes in your health.

## 2021-12-15 NOTE — ED Provider Notes (Signed)
[facility] EMERGENCY DEPARTMENT Provider Note   CSN: [id number] Arrival date & time: 12/15/21  1547     History  Chief Complaint  Patient presents with   URI    Cameron Thompson is a 41 y.o. male.  Presents to the emergency department for evaluation of cough congestion sore throat, wheezing.  Symptoms been present for 1 week.  Initially developed some fevers but no recent fevers.  Denies any body aches, chills, abdominal pain nausea or vomiting.  Patient has a history of asthma, ran out of his albuterol inhaler  HPI     Home Medications Prior to Admission medications   Medication Sig Start Date End Date Taking? Authorizing Provider  albuterol (VENTOLIN HFA) 108 (90 Base) MCG/ACT inhaler Inhale 2 puffs into the lungs every 6 (six) hours as needed for wheezing or shortness of breath. 12/15/21  Yes Evon Slack, PA-C  doxycycline (MONODOX) 100 MG capsule Take 1 capsule (100 mg total) by mouth 2 (two) times daily for 10 days. 12/15/21 12/25/21 Yes Evon Slack, PA-C  gabapentin (NEURONTIN) 100 MG capsule Take 1 capsule (100 mg total) by mouth 3 (three) times daily. 04/18/21   Ostwalt, Edmon Crape, PA-C  naproxen (NAPROSYN) 500 MG tablet Take 1 tablet (500 mg total) by mouth 2 (two) times daily. 09/11/19   Domenick Gong, MD  mupirocin nasal ointment (BACTROBAN NASAL) 2 % Apply small amount to right nostril twice daily as directed. Patient not taking: Reported on 03/03/2019 01/27/19 09/11/19  Flinchum, Eula Fried, FNP      Allergies    Kiwi extract    Review of Systems   Review of Systems  Physical Exam Updated Vital Signs BP (!) 141/112   Pulse (!) 103   Temp 98.3 F (36.8 C) (Oral)   Resp 20   Ht 5\' 9"  (1.753 m)   Wt (!) 171.1 kg   SpO2 93%   BMI 55.70 kg/m  Physical Exam Constitutional:      Appearance: He is well-developed.  HENT:     Head: Normocephalic and atraumatic.     Comments: Positive frontal sinus tenderness with palpation.  No  pharyngeal erythema or exudates. Eyes:     Conjunctiva/sclera: Conjunctivae normal.  Cardiovascular:     Rate and Rhythm: Normal rate and regular rhythm.     Pulses: Normal pulses.     Heart sounds: Normal heart sounds. No murmur heard. Pulmonary:     Effort: Pulmonary effort is normal. No respiratory distress.     Breath sounds: No stridor. Wheezing present. No rales.  Abdominal:     General: There is no distension.     Tenderness: There is no abdominal tenderness. There is no guarding.  Musculoskeletal:        General: No swelling, tenderness or deformity. Normal range of motion.     Cervical back: Normal range of motion.  Skin:    General: Skin is warm.     Capillary Refill: Capillary refill takes less than 2 seconds.     Findings: No rash.  Neurological:     General: No focal deficit present.     Mental Status: He is alert and oriented to person, place, and time. Mental status is at baseline.  Psychiatric:        Behavior: Behavior normal.        Thought Content: Thought content normal.     ED Results / Procedures / Treatments   Labs (all labs ordered are listed, but only abnormal results  are displayed) Labs Reviewed  RESP PANEL BY RT-PCR (FLU A&B, COVID) [id number]    EKG None  Radiology DG Chest 2 View  Result Date: 12/15/2021 CLINICAL DATA:  Provided history: Cough, shortness of breath. Additional history provided: Sinus congestion, sore throat, chills, body aches, history of asthma. EXAM: CHEST - 2 VIEW COMPARISON:  Prior chest radiographs 09/11/2019 and earlier. FINDINGS: Heart size within normal limits. No appreciable airspace consolidation. No evidence of pleural effusion or pneumothorax. No acute bony abnormality identified. Dextrocurvature of the thoracic spine. IMPRESSION: No evidence of active cardiopulmonary disease. Dextrocurvature of the thoracic spine. Electronically Signed   By: Jackey Loge D.O.   On: 12/15/2021 16:52    Procedures Procedures     Medications Ordered in ED Medications  ipratropium-albuterol (DUONEB) 0.5-2.5 (3) MG/3ML nebulizer solution 3 mL (has no administration in time range)  methylPREDNISolone sodium succinate (SOLU-MEDROL) 125 mg/2 mL injection 125 mg (has no administration in time range)  doxycycline (VIBRA-TABS) tablet 100 mg (has no administration in time range)    ED Course/ Medical Decision Making/ A&P                           Medical Decision Making Risk Prescription drug management.   41 year old male with viral symptoms x1 week.  Continues to have dry nonproductive cough.  Vital signs are stable, afebrile but does have some wheezing but subtle on exam.  No chest pain or shortness of breath.  Chest x-ray ordered and reviewed by me today shows no acute cardiopulmonary process.  Flu and COVID swab negative.  After 1 DuoNeb treatment and IM Solu-Medrol, wheezing resolved and patient with significant improvement in cough.  Lungs are clear to auscultation with good breath sounds bilaterally.  Patient discharged with doxycycline for sinusitis, albuterol for acute bronchitis.  He understands signs symptoms return to the ER for. Final Clinical Impression(s) / ED Diagnoses Final diagnoses:  Bronchitis  Wheezing  Acute non-recurrent frontal sinusitis    Rx / DC Orders ED Discharge Orders          Ordered    doxycycline (MONODOX) 100 MG capsule  2 times daily        12/15/21 1901    albuterol (VENTOLIN HFA) 108 (90 Base) MCG/ACT inhaler  Every 6 hours PRN        12/15/21 1901              Ronnette Juniper 12/15/21 1933    Willy Eddy, MD 12/15/21 2201

## 2021-12-15 NOTE — ED Provider Triage Note (Signed)
Emergency Medicine Provider Triage Evaluation Note  Cameron Thompson , a 41 y.o. male  was evaluated in triage.  Pt complains of cough, congestion, fever, sore throat x1 week. +sick contacts. No flu shot.   Review of Systems  Positive: cough, congestion, fever, sore throat Negative: cp  Physical Exam  Ht 5\' 9"  (1.753 m)   Wt (!) 171.1 kg   BMI 55.70 kg/m  Gen:   Awake, no distress   Resp:  Normal effort  MSK:   Moves extremities without difficulty  Other:  +nasal congestion  Medical Decision Making  Medically screening exam initiated at 4:21 PM.  Appropriate orders placed.  Cameron Thompson was informed that the remainder of the evaluation will be completed by another provider, this initial triage assessment does not replace that evaluation, and the importance of remaining in the ED until their evaluation is complete.     Revonda Standard, PA-C 12/15/21 1622

## 2021-12-15 NOTE — ED Triage Notes (Signed)
C/O cough, sinus congestion, sore throat, chills, and body aches x 1 week.  AAOx3.  Skin warm and dry. NAD

## 2022-08-05 ENCOUNTER — Ambulatory Visit: Payer: Self-pay | Admitting: Nurse Practitioner

## 2022-08-05 ENCOUNTER — Encounter: Payer: Self-pay | Admitting: Nurse Practitioner

## 2022-08-05 VITALS — BP 124/88 | HR 103 | Temp 97.7°F | Ht 68.5 in | Wt >= 6400 oz

## 2022-08-05 DIAGNOSIS — M545 Low back pain, unspecified: Secondary | ICD-10-CM

## 2022-08-05 DIAGNOSIS — N529 Male erectile dysfunction, unspecified: Secondary | ICD-10-CM | POA: Insufficient documentation

## 2022-08-05 DIAGNOSIS — Z8739 Personal history of other diseases of the musculoskeletal system and connective tissue: Secondary | ICD-10-CM | POA: Insufficient documentation

## 2022-08-05 DIAGNOSIS — G478 Other sleep disorders: Secondary | ICD-10-CM

## 2022-08-05 DIAGNOSIS — E559 Vitamin D deficiency, unspecified: Secondary | ICD-10-CM | POA: Diagnosis not present

## 2022-08-05 DIAGNOSIS — E039 Hypothyroidism, unspecified: Secondary | ICD-10-CM | POA: Diagnosis not present

## 2022-08-05 DIAGNOSIS — R5383 Other fatigue: Secondary | ICD-10-CM

## 2022-08-05 DIAGNOSIS — Z13220 Encounter for screening for lipoid disorders: Secondary | ICD-10-CM

## 2022-08-05 DIAGNOSIS — Z6841 Body Mass Index (BMI) 40.0 and over, adult: Secondary | ICD-10-CM

## 2022-08-05 NOTE — Assessment & Plan Note (Signed)
History of same pending uric acid level

## 2022-08-05 NOTE — Assessment & Plan Note (Signed)
Patient is trying to lose weight in the past.  He will see if zepbound or Reginal Lutes is covered.  Patient was also given information in regards to: [facility] pending TSH, lipid, A1c.

## 2022-08-05 NOTE — Assessment & Plan Note (Signed)
Patient's partner in the room states he does have sleep apnea.  Will make a referral to pulmonology to rule out sleep apnea

## 2022-08-05 NOTE — Assessment & Plan Note (Signed)
History of the same in setting of fatigue pending vitamin D level

## 2022-08-05 NOTE — Patient Instructions (Addendum)
Nice to see you today I will be in touch with the labs once I have reviewed them Make a lab appointment at 930am or earlier I will gauge your follow up based off labs or 6 months for a complete physical   [facility]  Address: [address] [place], [place], [place] [address] Phone: [phone]  Zepbound and wegovy are the two injectable weight loss medications

## 2022-08-05 NOTE — Progress Notes (Signed)
New Patient Office Visit  Subjective    Patient ID: Cameron Thompson, male    DOB: 11-17-1980  Age: 42 y.o. MRN: [id number]  CC:  Chief Complaint  Patient presents with   Establish Care   Weight Check   Back Pain    Pt states of lower back pain.    Erectile Dysfunction    Pt is concerned of possible ED.     HPI Cameron Thompson presents to establish care   Asthma: States several times a day throught the pollen season. PND at night. During allergy season. States that he did have a mantaincece inhaler.  Never done singlualr  Gout: states that he hass gout flares several times a year. States he has not been on prophalyxic medication.  Back pain: states that the back has been bothering him for 3 months without injury. Statse that if he lifts the left leg and it helps. Sitting helps. Walking makes it worse. Feels like he is being snapped in half and is on the both pain. Intermittent. States that he has taken naproxen in the past that does help. Denies numbness, tingling or weakness.   ED: states that he doe snot have nocturnal erections. Will get an erection when he is beingg intimate but will lose it. States that it has been going on a couple. States that he has not tired any medications for it   Weight loss: states that he has gained weight as of late. States that he does have trouble wiping his bottom and urinating. ADLs and iADLs by himself. States that he has tired to do shakes and modifyin his diet. States that he has lost 80 pounds in 8 weeks then gained it back. States no physical activity currently   Tdap: within 10 years Covid: original 2 Flu vaccine: out of season     Outpatient Encounter Medications as of 08/05/2022  Medication Sig   albuterol (VENTOLIN HFA) 108 (90 Base) MCG/ACT inhaler Inhale 2 puffs into the lungs every 6 (six) hours as needed for wheezing or shortness of breath.   naproxen (NAPROSYN) 500 MG tablet Take 1 tablet (500 mg total) by mouth 2 (two)  times daily.   gabapentin (NEURONTIN) 100 MG capsule Take 1 capsule (100 mg total) by mouth 3 (three) times daily. (Patient not taking: Reported on 08/05/2022)   [DISCONTINUED] mupirocin nasal ointment (BACTROBAN NASAL) 2 % Apply small amount to right nostril twice daily as directed. (Patient not taking: Reported on 03/03/2019)   No facility-administered encounter medications on file as of 08/05/2022.    Past Medical History:  Diagnosis Date   Allergy    Asthma    pollen season   Gout    History of kidney stones    Obese    Vitamin D deficiency     Past Surgical History:  Procedure Laterality Date   NO PAST SURGERIES     NO PAST SURGERIES     VENTRAL HERNIA REPAIR N/A 03/03/2019   Procedure: HERNIA REPAIR UMBILICAL ADULT;  Surgeon: Earline Mayotte, MD;  Location: [facility] ORS;  Service: General;  Laterality: N/A;    Family History  Problem Relation Age of Onset   Cancer Maternal Grandmother        lung   Diabetes Paternal Grandmother     Social History   Socioeconomic History   Marital status: Single    Spouse name: Not on file   Number of children: 0   Years of education: Not on file  Highest education level: Not on file  Occupational History   Not on file  Tobacco Use   Smoking status: Never   Smokeless tobacco: Never  Vaping Use   Vaping status: Never Used  Substance and Sexual Activity   Alcohol use: Yes    Comment: occasionally   Drug use: No   Sexual activity: Not on file  Other Topics Concern   Not on file  Social History Narrative   Fulltime: Customer solutions representative      Hobbies: puzzles, swimming,    Social Determinants of Health   Financial Resource Strain: Not on file  Food Insecurity: Not on file  Transportation Needs: Not on file  Physical Activity: Not on file  Stress: Not on file  Social Connections: Not on file  Intimate Partner Violence: Not on file    Review of Systems  Constitutional:  Negative for chills and fever.   Respiratory:  Positive for shortness of breath (with exertion).   Cardiovascular:  Negative for chest pain.  Gastrointestinal:  Negative for abdominal pain, constipation, diarrhea, nausea and vomiting.       Bm daily   Genitourinary:  Negative for dysuria and flank pain.  Musculoskeletal:  Positive for back pain.  Neurological:  Negative for tingling and headaches.  Psychiatric/Behavioral:  Negative for hallucinations and suicidal ideas.         Objective    BP 124/88   Pulse (!) 103   Temp 97.7 F (36.5 C) (Temporal)   Ht 5' 8.5" (1.74 m)   Wt (!) 416 lb (188.7 kg)   SpO2 94%   BMI 62.33 kg/m   Physical Exam Vitals and nursing note reviewed.  Constitutional:      Appearance: Normal appearance.  HENT:     Right Ear: Tympanic membrane, ear canal and external ear normal.     Left Ear: Tympanic membrane, ear canal and external ear normal.     Mouth/Throat:     Mouth: Mucous membranes are moist.     Pharynx: Oropharynx is clear.  Eyes:     Extraocular Movements: Extraocular movements intact.     Pupils: Pupils are equal, round, and reactive to light.  Cardiovascular:     Rate and Rhythm: Normal rate and regular rhythm.     Pulses: Normal pulses.     Heart sounds: Normal heart sounds.  Pulmonary:     Effort: Pulmonary effort is normal.     Breath sounds: Normal breath sounds.  Musculoskeletal:        General: No tenderness or signs of injury.     Lumbar back: No tenderness or bony tenderness. Negative right straight leg raise test and negative left straight leg raise test.       Back:     Right lower leg: No edema.     Left lower leg: No edema.  Lymphadenopathy:     Cervical: No cervical adenopathy.  Skin:    General: Skin is warm.  Neurological:     General: No focal deficit present.     Mental Status: He is alert.     Deep Tendon Reflexes:     Reflex Scores:      Bicep reflexes are 2+ on the right side and 2+ on the left side.      Patellar reflexes are 2+  on the right side and 2+ on the left side.    Comments: Bilateral upper and lower extremity strength 5/5  Psychiatric:  Mood and Affect: Mood normal.        Behavior: Behavior normal.        Thought Content: Thought content normal.        Judgment: Judgment normal.         Assessment & Plan:   Problem List Items Addressed This Visit       Endocrine   Hypothyroidism    Historical diagnosis with history of treatment in the past per patient report states medication was ineffective and no longer on it pending TSH today      Relevant Orders   TSH     Nervous and Auditory   Non-restorative sleep - Primary    Patient's partner in the room states he does have sleep apnea.  Will make a referral to pulmonology to rule out sleep apnea      Relevant Orders   Ambulatory referral to Pulmonology     Other   Morbid obesity [facility])    Patient is trying to lose weight in the past.  He will see if zepbound or Reginal Lutes is covered.  Patient was also given information in regards to: Healthy weight and wellness clinic pending TSH, lipid, A1c.      Relevant Orders   Hemoglobin A1c   Lipid panel   Vitamin D deficiency    History of the same in setting of fatigue pending vitamin D level      Relevant Orders   VITAMIN D 25 Hydroxy (Vit-D Deficiency, Fractures)   Lumbar back pain    Has been at least 3 months.  Cannot elicit on exam today likely muscle in nature.  He was referral to PT patient is going to work on weight loss which will be beneficial to his back pain.      Relevant Orders   Ambulatory referral to Physical Therapy   Erectile dysfunction    Can get an erection at times when he does not want and has difficulty getting erection and during intimate times will check basic labs inclusive of TSH cholesterol and testosterone.      Relevant Orders   Testosterone   History of gout    History of same pending uric acid level      Relevant Orders   Uric acid   Fatigue     Check CBC, c-Met, vitamin B12, vitamin D and TSH.  Also ambulatory referral to pulmonology for sleep apnea study      Relevant Orders   CBC   Comprehensive metabolic panel   VITAMIN D 25 Hydroxy (Vit-D Deficiency, Fractures)   Vitamin B12   Ambulatory referral to Pulmonology   TSH   Other Visit Diagnoses     Screening for lipid disorders       Relevant Orders   Lipid panel       Return in about 6 months (around 02/05/2023), or if symptoms worsen or fail to improve, for CPE and Labs.   Audria Nine, NP

## 2022-08-05 NOTE — Assessment & Plan Note (Signed)
Historical diagnosis with history of treatment in the past per patient report states medication was ineffective and no longer on it pending TSH today

## 2022-08-05 NOTE — Assessment & Plan Note (Signed)
Has been at least 3 months.  Cannot elicit on exam today likely muscle in nature.  He was referral to PT patient is going to work on weight loss which will be beneficial to his back pain.

## 2022-08-05 NOTE — Assessment & Plan Note (Signed)
Check CBC, c-Met, vitamin B12, vitamin D and TSH.  Also ambulatory referral to pulmonology for sleep apnea study

## 2022-08-05 NOTE — Assessment & Plan Note (Signed)
Can get an erection at times when he does not want and has difficulty getting erection and during intimate times will check basic labs inclusive of TSH cholesterol and testosterone.

## 2022-08-06 ENCOUNTER — Other Ambulatory Visit (INDEPENDENT_AMBULATORY_CARE_PROVIDER_SITE_OTHER): Payer: BC Managed Care – PPO

## 2022-08-06 DIAGNOSIS — E039 Hypothyroidism, unspecified: Secondary | ICD-10-CM | POA: Diagnosis not present

## 2022-08-06 DIAGNOSIS — E559 Vitamin D deficiency, unspecified: Secondary | ICD-10-CM

## 2022-08-06 DIAGNOSIS — Z8739 Personal history of other diseases of the musculoskeletal system and connective tissue: Secondary | ICD-10-CM

## 2022-08-06 DIAGNOSIS — R5383 Other fatigue: Secondary | ICD-10-CM

## 2022-08-06 DIAGNOSIS — Z13220 Encounter for screening for lipoid disorders: Secondary | ICD-10-CM

## 2022-08-06 DIAGNOSIS — N529 Male erectile dysfunction, unspecified: Secondary | ICD-10-CM | POA: Diagnosis not present

## 2022-08-06 LAB — CBC
HCT: 42.8 % (ref 39.0–52.0)
Hemoglobin: 14 g/dL (ref 13.0–17.0)
MCHC: 32.6 g/dL (ref 30.0–36.0)
MCV: 90.3 fl (ref 78.0–100.0)
Platelets: 313 10*3/uL (ref 150.0–400.0)
RBC: 4.74 Mil/uL (ref 4.22–5.81)
RDW: 15 % (ref 11.5–15.5)
WBC: 8.7 10*3/uL (ref 4.0–10.5)

## 2022-08-06 LAB — TSH: TSH: 1.99 u[IU]/mL (ref 0.35–5.50)

## 2022-08-06 LAB — COMPREHENSIVE METABOLIC PANEL
ALT: 56 U/L — ABNORMAL HIGH (ref 0–53)
AST: 38 U/L — ABNORMAL HIGH (ref 0–37)
Albumin: 4 g/dL (ref 3.5–5.2)
Alkaline Phosphatase: 74 U/L (ref 39–117)
BUN: 9 mg/dL (ref 6–23)
CO2: 31 mEq/L (ref 19–32)
Calcium: 9.1 mg/dL (ref 8.4–10.5)
Chloride: 101 mEq/L (ref 96–112)
Creatinine, Ser: 0.72 mg/dL (ref 0.40–1.50)
GFR: 111.76 mL/min (ref >60.00)
Glucose, Bld: 111 mg/dL — ABNORMAL HIGH (ref 70–99)
Potassium: 4.4 mEq/L (ref 3.5–5.1)
Sodium: 140 mEq/L (ref 135–145)
Total Bilirubin: 0.3 mg/dL (ref 0.2–1.2)
Total Protein: 6.3 g/dL (ref 6.0–8.3)

## 2022-08-06 LAB — HEMOGLOBIN A1C: Hgb A1c MFr Bld: 6.2 % (ref 4.6–6.5)

## 2022-08-06 LAB — LIPID PANEL
Cholesterol: 188 mg/dL (ref 0–200)
HDL: 37.7 mg/dL — ABNORMAL LOW (ref >39.00)
NonHDL: 150.28
Total CHOL/HDL Ratio: 5
Triglycerides: 257 mg/dL — ABNORMAL HIGH (ref 0.0–149.0)
VLDL: 51.4 mg/dL — ABNORMAL HIGH (ref 0.0–40.0)

## 2022-08-06 LAB — VITAMIN D 25 HYDROXY (VIT D DEFICIENCY, FRACTURES): VITD: 9.98 ng/mL — ABNORMAL LOW (ref 30.00–100.00)

## 2022-08-06 LAB — TESTOSTERONE: Testosterone: 131.17 ng/dL — ABNORMAL LOW (ref 300.00–890.00)

## 2022-08-06 LAB — VITAMIN B12: Vitamin B-12: 366 pg/mL (ref 211–911)

## 2022-08-06 LAB — LDL CHOLESTEROL, DIRECT: Direct LDL: 75 mg/dL

## 2022-08-06 LAB — URIC ACID: Uric Acid, Serum: 8 mg/dL — ABNORMAL HIGH (ref 4.0–7.8)

## 2022-08-10 ENCOUNTER — Other Ambulatory Visit: Payer: Self-pay | Admitting: Nurse Practitioner

## 2022-08-10 DIAGNOSIS — E559 Vitamin D deficiency, unspecified: Secondary | ICD-10-CM

## 2022-08-10 DIAGNOSIS — R7303 Prediabetes: Secondary | ICD-10-CM

## 2022-08-10 DIAGNOSIS — R7989 Other specified abnormal findings of blood chemistry: Secondary | ICD-10-CM

## 2022-08-10 MED ORDER — VITAMIN D (ERGOCALCIFEROL) 1.25 MG (50000 UNIT) PO CAPS: 50000.0000 [IU] | ORAL_CAPSULE | ORAL | 0 refills | Status: DC

## 2022-08-12 ENCOUNTER — Telehealth: Payer: Self-pay | Admitting: Nurse Practitioner

## 2022-08-12 DIAGNOSIS — R7989 Other specified abnormal findings of blood chemistry: Secondary | ICD-10-CM

## 2022-08-12 NOTE — Telephone Encounter (Signed)
Pt called stating his insurance is requiring a letter of necessity from [place] for the meds, Zepouund & Wegovy. Pt states the letter will determine if they'll cover the meds or not. Pt states letter needs to be fax. Fax # is [phone].

## 2022-08-12 NOTE — Telephone Encounter (Signed)
Letter has been drafted and signed. It is in the outgoing CMA box

## 2022-08-12 NOTE — Telephone Encounter (Signed)
Order placed

## 2022-08-12 NOTE — Telephone Encounter (Signed)
-----   Message from [facility] T sent at 08/12/2022  1:16 PM EDT ----- Patient called back app set up. Did not see order.

## 2022-08-13 NOTE — Telephone Encounter (Signed)
Form faxed my chart sent to patient to let him know.

## 2022-09-02 ENCOUNTER — Other Ambulatory Visit (INDEPENDENT_AMBULATORY_CARE_PROVIDER_SITE_OTHER): Payer: BC Managed Care – PPO

## 2022-09-02 DIAGNOSIS — R7989 Other specified abnormal findings of blood chemistry: Secondary | ICD-10-CM | POA: Diagnosis not present

## 2022-09-04 LAB — TESTOSTERONE: Testosterone: 144.52 ng/dL — ABNORMAL LOW (ref 300.00–890.00)

## 2022-09-16 ENCOUNTER — Encounter (INDEPENDENT_AMBULATORY_CARE_PROVIDER_SITE_OTHER): Payer: Self-pay | Admitting: Family Medicine

## 2022-09-16 ENCOUNTER — Telehealth (INDEPENDENT_AMBULATORY_CARE_PROVIDER_SITE_OTHER): Payer: BC Managed Care – PPO | Admitting: Family Medicine

## 2022-09-16 DIAGNOSIS — R7303 Prediabetes: Secondary | ICD-10-CM | POA: Diagnosis not present

## 2022-09-16 DIAGNOSIS — E785 Hyperlipidemia, unspecified: Secondary | ICD-10-CM

## 2022-09-16 DIAGNOSIS — E669 Obesity, unspecified: Secondary | ICD-10-CM

## 2022-09-16 DIAGNOSIS — E7849 Other hyperlipidemia: Secondary | ICD-10-CM

## 2022-09-16 DIAGNOSIS — Z6841 Body Mass Index (BMI) 40.0 and over, adult: Secondary | ICD-10-CM

## 2022-09-16 NOTE — Progress Notes (Signed)
Office: [phone]  /  Fax: [phone]   TeleHealth Visit:  This visit was completed with telemedicine (audio/video) technology. [name] has verbally consented to this TeleHealth visit. The patient is located at home, the provider is located at home. The participants in this visit include the listed provider and patient. The visit was conducted today via phone call.  Unsuccessful attempt was made to connect to video. Length of call was 18 minutes.   Initial Visit  Cameron Thompson was seen via virtual visit today to evaluate for treatment of obesity. He is interested in losing weight to improve overall health and reduce the risk of weight related complications. He presents today to review program treatment options, initial physical assessment, and evaluation.     Height: 5' 8.5" Weight: 416 lbs BMI: 62  He was referred by: Self-Referral  When asked what else they would like to accomplish? He states: Adopt healthier eating patterns, Improve energy levels and physical activity, Improve existing medical conditions, and Improve quality of life  Weight history: Began struggling with weight since he was a child.  When asked how has your weight affected you? He states: Contributed to medical problems, Contributed to orthopedic problems or mobility issues, and Having poor endurance  Some associated conditions: Hyperlipidemia and Prediabetes  Contributing factors: Nutritional  Weight promoting medications identified: None  Current nutrition plan: None  Current level of physical activity: None  Current or previous pharmacotherapy: None  Response to medication: Never tried medications  Past medical history includes:   Past Medical History:  Diagnosis Date   Allergy    Asthma    pollen season   Gout    History of kidney stones    Obese    Vitamin D deficiency      Objective:     General:  Alert, oriented and cooperative. Patient is in no acute distress.  Respiratory:  Normal respiratory effort, no problems with respiration noted  Mental Status: Normal mood and affect. Normal behavior. Normal judgment and thought content.    Assessment and Plan:   1. Prediabetes Last A1c was 6.2  Medication(s): None  Lab Results  Component Value Date   HGBA1C 6.2 08/06/2022   HGBA1C 5.7 (H) 03/28/2020   HGBA1C 5.6 02/06/2019   No results found for: "INSULIN"  Plan: Work on lifestyle interventions in conjunction with our program.    2. Hyperlipidemia LDL is at goal.  Triglycerides elevated at 257.  HDL low at 37.  Liver enzymes elevated. Medication(s): none  Lab Results  Component Value Date   CHOL 188 08/06/2022   HDL 37.70 (L) 08/06/2022   LDLCALC 96 03/28/2020   LDLDIRECT 75.0 08/06/2022   TRIG 257.0 (H) 08/06/2022   CHOLHDL 5 08/06/2022   Lab Results  Component Value Date   ALT 56 (H) 08/06/2022   AST 38 (H) 08/06/2022   ALKPHOS 74 08/06/2022   BILITOT 0.3 08/06/2022   The 10-year ASCVD risk score (Arnett DK, et al., 2019) is: 2%   Values used to calculate the score:     Age: 42 years     Sex: Male     Is Non-Hispanic African American: No     Diabetic: No     Tobacco smoker: No     Systolic Blood Pressure: 124 mmHg     Is BP treated: No     HDL Cholesterol: 37.7 mg/dL     Total Cholesterol: 188 mg/dL  Plan: Work on lifestyle interventions in conjunction with our program.  3. Obesity Treatment / Action Plan:  Will complete provided nutritional and psychosocial assessment questionnaire before the next appointment. Will be scheduled for indirect calorimetry to determine resting energy expenditure in a fasting state.  This will allow [place] to create a reduced calorie, high-protein meal plan to promote loss of fat mass while preserving muscle mass. Was counseled on pharmacotherapy and role as an adjunct in weight management.   Obesity Education Performed Today:  We discussed obesity as a disease and the importance of a more detailed  evaluation of all the factors contributing to the disease.  We discussed the importance of long term lifestyle changes which include nutrition, exercise and behavioral modifications as well as the importance of customizing this to his specific health and social needs.  We discussed the benefits of reaching a healthier weight to alleviate the symptoms of existing conditions and reduce the risks of the biomechanical, metabolic and psychological effects of obesity.  [name] appears to be in the action stage of change and states they are ready to start intensive lifestyle modifications and behavioral modifications.  ______________________________________________________________________________  He will be contacted by [facility] to set up initial appointment and the first follow up appointment with a physician.   The following office policies were discussed. He voiced understanding: - Patient will be considered late at 6 minutes past appointment time.   - For the first office visit, patient needs to arrive 1 hour early, fasting except for water. Patient should arrive 15 minutes early for all other visits. -  Patient will bring completed new patient paperwork to first office visit.  If not, appointment will be rescheduled.  30 minutes was spent today on this visit including the above counseling, pre-visit chart review, and post-visit documentation.  Reviewed by clinician on day of visit: allergies, medications, problem list, medical history, surgical history, family history, social history, and previous encounter notes pertinent to obesity diagnosis.    Jesse Sans, FNP

## 2022-09-23 ENCOUNTER — Encounter: Payer: Self-pay | Admitting: Nurse Practitioner

## 2022-09-23 ENCOUNTER — Ambulatory Visit: Payer: BC Managed Care – PPO | Admitting: Nurse Practitioner

## 2022-09-23 ENCOUNTER — Institutional Professional Consult (permissible substitution): Payer: BC Managed Care – PPO | Admitting: Nurse Practitioner

## 2022-09-23 VITALS — BP 122/82 | HR 101 | Temp 97.7°F | Ht 68.5 in | Wt 395.2 lb

## 2022-09-23 DIAGNOSIS — E291 Testicular hypofunction: Secondary | ICD-10-CM | POA: Diagnosis not present

## 2022-09-23 DIAGNOSIS — Z23 Encounter for immunization: Secondary | ICD-10-CM | POA: Diagnosis not present

## 2022-09-23 MED ORDER — TESTOSTERONE CYPIONATE 200 MG/ML IM SOLN: 200.0000 mg | INTRAMUSCULAR | 2 refills | Status: DC

## 2022-09-23 NOTE — Patient Instructions (Signed)
Nice to see you today We did update your tetanus and flu vaccine today Once you pick up the testosterone call and schedule a nurse visit only Need a fasting early morning lab visit in 3 months

## 2022-09-23 NOTE — Assessment & Plan Note (Signed)
Discussed potential risks and side effects with testosterone use inclusive of polycythemia vera, DVT, increased cardiovascular risk inclusive of stroke and heart attack and increased risk for prostate cancer.  Patient elected to continue 1 to 200 mg of testosterone IM every other week.  Patient to follow-up in 3 months for labs to also do his physical labs at that juncture

## 2022-09-23 NOTE — Addendum Note (Signed)
Addended by: Melina Copa on: 09/23/2022 03:39 PM   Modules accepted: Orders

## 2022-09-23 NOTE — Progress Notes (Signed)
   Established Patient Office Visit  Subjective   Patient ID: Cameron Thompson, male    DOB: 28-Jul-1980  Age: 42 y.o. MRN: [id number]  Chief Complaint  Patient presents with   Follow-up    Pt complains of need to discuss testosterone tests that were done in past visits.    Immunizations    Yes to Tdap and flu shot.     HPI  Hypogonadism: Patient was seen by me on 08/05/2018 for establish of care did have concerns in regards to hypogonadism with fatigue inability lose weight and ED.  Testosterone level was checkedAnd it came back low at 131.  Patient had a recheck almost a month laterAt 144.52.  Patient is here to discuss testosterone replacement.  Patient does have a history of hypertension, morbid obesity  Patient has no personal history of blood clots.  States that his mom recently had a blood clot unsure if he provoked or not.  She is currently on anticoagulation has not seen hematology thus far.    ROS    Objective:     BP 122/82   Pulse (!) 101   Temp 97.7 F (36.5 C) (Temporal)   Ht 5' 8.5" (1.74 m)   Wt (!) 395 lb 3.2 oz (179.3 kg)   SpO2 95%   BMI 59.22 kg/m  BP Readings from Last 3 Encounters:  09/23/22 122/82  08/05/22 124/88  12/15/21 (!) 141/112   Wt Readings from Last 3 Encounters:  09/23/22 (!) 395 lb 3.2 oz (179.3 kg)  08/05/22 (!) 416 lb (188.7 kg)  12/15/21 (!) 377 lb 3.3 oz (171.1 kg)   SpO2 Readings from Last 3 Encounters:  09/23/22 95%  08/05/22 94%  12/15/21 93%      Physical Exam Vitals and nursing note reviewed. Exam conducted with a chaperone present Melina Copa, CMA).  Constitutional:      Appearance: Normal appearance.  Cardiovascular:     Rate and Rhythm: Normal rate and regular rhythm.     Heart sounds: Normal heart sounds.  Pulmonary:     Effort: Pulmonary effort is normal.     Breath sounds: Normal breath sounds.  Genitourinary:    Prostate: Normal.     Comments: Exam limited due to body habitus  Neurological:      Mental Status: He is alert.      No results found for any visits on 09/23/22.    The 10-year ASCVD risk score (Arnett DK, et al., 2019) is: 1.9%    Assessment & Plan:   Problem List Items Addressed This Visit       Endocrine   Hypogonadism in male - Primary    Discussed potential risks and side effects with testosterone use inclusive of polycythemia vera, DVT, increased cardiovascular risk inclusive of stroke and heart attack and increased risk for prostate cancer.  Patient elected to continue 1 to 200 mg of testosterone IM every other week.  Patient to follow-up in 3 months for labs to also do his physical labs at that juncture      Relevant Medications   testosterone cypionate (DEPOTESTOSTERONE CYPIONATE) 200 MG/ML injection   Other Visit Diagnoses     Need for influenza vaccination       Need for Tdap vaccination           Return if symptoms worsen or fail to improve, for As scheduled.    Audria Nine, NP

## 2022-10-01 ENCOUNTER — Ambulatory Visit (INDEPENDENT_AMBULATORY_CARE_PROVIDER_SITE_OTHER): Payer: BC Managed Care – PPO

## 2022-10-01 DIAGNOSIS — E291 Testicular hypofunction: Secondary | ICD-10-CM | POA: Diagnosis not present

## 2022-10-01 MED ORDER — TESTOSTERONE CYPIONATE 200 MG/ML IM SOLN
200.0000 mg | Freq: Once | INTRAMUSCULAR | Status: AC
  Administered 2022-10-01: 200 mg via INTRAMUSCULAR

## 2022-10-01 NOTE — Progress Notes (Signed)
Per orders of Mordecai Maes, NP who is out of office, and Mort Sawyers FNP who is in office injection of testosterone 200 mg given by Lewanda Rife in [place]. Patient tolerated injection well. Patient will make appointment for 2 week.

## 2022-10-04 DIAGNOSIS — Z8739 Personal history of other diseases of the musculoskeletal system and connective tissue: Secondary | ICD-10-CM | POA: Diagnosis not present

## 2022-10-04 DIAGNOSIS — M25561 Pain in right knee: Secondary | ICD-10-CM | POA: Diagnosis not present

## 2022-10-05 DIAGNOSIS — M25461 Effusion, right knee: Secondary | ICD-10-CM | POA: Diagnosis not present

## 2022-10-05 DIAGNOSIS — M25561 Pain in right knee: Secondary | ICD-10-CM | POA: Diagnosis not present

## 2022-10-07 ENCOUNTER — Ambulatory Visit (INDEPENDENT_AMBULATORY_CARE_PROVIDER_SITE_OTHER): Payer: BC Managed Care – PPO | Admitting: Nurse Practitioner

## 2022-10-07 ENCOUNTER — Encounter: Payer: Self-pay | Admitting: Nurse Practitioner

## 2022-10-07 VITALS — BP 110/90 | HR 84 | Ht 69.0 in | Wt >= 6400 oz

## 2022-10-07 DIAGNOSIS — G4719 Other hypersomnia: Secondary | ICD-10-CM

## 2022-10-07 DIAGNOSIS — R0683 Snoring: Secondary | ICD-10-CM | POA: Diagnosis not present

## 2022-10-07 DIAGNOSIS — E661 Drug-induced obesity: Secondary | ICD-10-CM

## 2022-10-07 DIAGNOSIS — Z6841 Body Mass Index (BMI) 40.0 and over, adult: Secondary | ICD-10-CM

## 2022-10-07 DIAGNOSIS — R0681 Apnea, not elsewhere classified: Secondary | ICD-10-CM

## 2022-10-07 NOTE — Progress Notes (Unsigned)
@Patient  ID: Cameron Thompson, male    DOB: [birth year], 42 y.o.   MRN: [id number]  Chief Complaint  Patient presents with   Consult    Referring provider: Eden Emms, NP  HPI: 42 year old male, never smoker referred for sleep consult. Past medical history significant for HTN, hypothyroid, obesity, ED.  TEST/EVENTS:   10/07/2022: Today - sleep consult Patient presents today for sleep consult with his partner, referred by Mordecai Maes, NP.  Has had trouble with his sleep for many years now.  Snores very loudly at night.  Has been told that he stops breathing.  Wakes up often.  Does not feel like he sleeps very restfully.  Always is tired in the morning and throughout the day.  He does have some teeth grinding at night. Occasional sleep talking. No sleep walking. Has had infrequent episodes of sleep paralysis. No drowsy driving or morning headaches.  He goes to bed between 10 PM and midnight.  Falls asleep within 20 minutes.  Wakes at least 3 times a night.  Usually gets up around 7:30 AM.  Does not operate any heavy machinery in his job field.  Weight is up about 40 pounds over the last 2 years.  Never had a previous sleep study.  Does not use any oxygen. No history of heart disease, stroke or diabetes.  He does have seasonal asthma which is well-controlled with as needed [place]. He is a never smoker.  Does not drink alcohol.  Has cut out caffeine for the most part aside from an occasional soda or tea.  No sleep aids. Lives with a roommate.  Works as a [employer] loss adjuster.  No significant family history reported.  Epworth 13   Allergies  Allergen Reactions   Kiwi Extract Anaphylaxis    Immunization History  Administered Date(s) Administered   Influenza, Seasonal, Injecte, Preservative Fre 09/23/2022   Influenza,inj,Quad PF,6+ Mos 01/27/2019   PFIZER(Purple Top)SARS-COV-2 Vaccination 05/11/2019, 06/06/2019   Tdap 09/23/2022    Past Medical History:  Diagnosis Date   Allergy     Asthma    pollen season   Gout    History of kidney stones    Obese    Vitamin D deficiency     Tobacco History: Social History   Tobacco Use  Smoking Status Never  Smokeless Tobacco Never   Counseling given: Not Answered   Outpatient Medications Prior to Visit  Medication Sig Dispense Refill   albuterol (VENTOLIN HFA) 108 (90 Base) MCG/ACT inhaler Inhale 2 puffs into the lungs every 6 (six) hours as needed for wheezing or shortness of breath. 8 g 0   naproxen (NAPROSYN) 500 MG tablet Take 1 tablet (500 mg total) by mouth 2 (two) times daily. (Patient taking differently: Take 500 mg by mouth 2 (two) times daily as needed.) 20 tablet 0   predniSONE (DELTASONE) 10 MG tablet Take 10 mg by mouth daily with breakfast.     testosterone cypionate (DEPOTESTOSTERONE CYPIONATE) 200 MG/ML injection Inject 1 mL (200 mg total) into the muscle every 14 (fourteen) days. 2 mL 2   traMADol (ULTRAM) 50 MG tablet Take 50 mg by mouth every 8 (eight) hours as needed.     Vitamin D, Ergocalciferol, (DRISDOL) 1.25 MG (50000 UNIT) CAPS capsule Take 1 capsule (50,000 Units total) by mouth every 7 (seven) days. 12 capsule 0   gabapentin (NEURONTIN) 100 MG capsule Take 1 capsule (100 mg total) by mouth 3 (three) times daily. (Patient not taking: Reported on 10/07/2022)  90 capsule 3   No facility-administered medications prior to visit.     Review of Systems:   Constitutional: No night sweats, fevers, chills,  or lassitude. +weight gain, fatigue  HEENT: No headaches, difficulty swallowing, tooth/dental problems, or sore throat. No sneezing, itching, ear ache, nasal congestion, or post nasal drip. +bruxism CV:  No chest pain, orthopnea, PND, swelling in lower extremities, anasarca, dizziness, palpitations, syncope Resp: +snoring, witnessed apneas, baseline shortness of breath with exertion. No excess mucus or change in color of mucus. No productive or non-productive. No hemoptysis. No wheezing.  No chest wall  deformity GI:  No heartburn, indigestion, abdominal pain, nausea, vomiting, diarrhea, change in bowel habits, loss of appetite, bloody stools.  GU: No dysuria, change in color of urine, urgency or frequency.   Skin: No rash, lesions, ulcerations MSK:  No joint pain or swelling.   Neuro: No dizziness or lightheadedness.  Psych: No depression or anxiety. Mood stable. +sleep disturbance     Physical Exam:  BP (!) 110/90 (BP Location: Right Arm, Patient Position: Sitting, Cuff Size: Large)   Pulse 84   Ht 5\' 9"  (1.753 m)   Wt (!) 408 lb 9.6 oz (185.3 kg)   SpO2 97%   BMI 60.34 kg/m   GEN: Pleasant, interactive, well-kempt; morbidly obese; in no acute distress. HEENT:  Normocephalic and atraumatic. PERRLA. Sclera white. Nasal turbinates pink, moist and patent bilaterally. No rhinorrhea present. Oropharynx pink and moist, without exudate or edema. No lesions, ulcerations, or postnasal drip. Mallampati III NECK:  Supple w/ fair ROM. No JVD present. Normal carotid impulses w/o bruits. Thyroid symmetrical with no goiter or nodules palpated. No lymphadenopathy.   CV: RRR, no m/r/g, no peripheral edema. Pulses intact, +2 bilaterally. No cyanosis, pallor or clubbing. PULMONARY:  Unlabored, regular breathing. Clear bilaterally A&P w/o wheezes/rales/rhonchi. No accessory muscle use.  GI: BS present and normoactive. Soft, non-tender to palpation. No organomegaly or masses detected.  MSK: No erythema, warmth or tenderness. Cap refil <2 sec all extrem. No deformities or joint swelling noted.  Neuro: A/Ox3. No focal deficits noted.   Skin: Warm, no lesions or rashe Psych: Normal affect and behavior. Judgement and thought content appropriate.     Lab Results:  CBC    Component Value Date/Time   WBC 8.7 08/06/2022 0925   RBC 4.74 08/06/2022 0925   HGB 14.0 08/06/2022 0925   HGB 15.0 03/28/2020 1050   HCT 42.8 08/06/2022 0925   HCT 44.7 03/28/2020 1050   PLT 313.0 08/06/2022 0925   PLT 304  03/28/2020 1050   MCV 90.3 08/06/2022 0925   MCV 88 03/28/2020 1050   MCH 29.5 03/28/2020 1050   MCH 29.3 09/11/2019 1647   MCHC 32.6 08/06/2022 0925   RDW 15.0 08/06/2022 0925   RDW 13.4 03/28/2020 1050   LYMPHSABS 1.9 03/28/2020 1050   MONOABS 0.8 09/11/2019 1647   EOSABS 0.3 03/28/2020 1050   BASOSABS 0.1 03/28/2020 1050    BMET    Component Value Date/Time   NA 140 08/06/2022 0925   NA 141 03/28/2020 1050   K 4.4 08/06/2022 0925   CL 101 08/06/2022 0925   CO2 31 08/06/2022 0925   GLUCOSE 111 (H) 08/06/2022 0925   BUN 9 08/06/2022 0925   BUN 7 03/28/2020 1050   CREATININE 0.72 08/06/2022 0925   CALCIUM 9.1 08/06/2022 0925   GFRNONAA 108 02/27/2020 0910   GFRAA 125 02/27/2020 0910    BNP No results found for: "BNP"   Imaging:  No results found.  testosterone cypionate (DEPOTESTOSTERONE CYPIONATE) injection 200 mg     Date Action Dose Route User   10/01/2022 1218 Given 200 mg Intramuscular (Left Upper Outer Quadrant) Isley, Rena M, LPN           No data to display          No results found for: "NITRICOXIDE"      Assessment & Plan:   Excessive daytime sleepiness He has snoring, excessive daytime sleepiness, nocturnal apneic events, restless sleep. BMI 60. Epworth 13. Given this,  I am concerned he could have sleep disordered breathing with obstructive sleep apnea as well as obesity hypoventilation syndrome. With his current BMI, he will need an in lab sleep study for further evaluation.    - discussed how weight can impact sleep and risk for sleep disordered breathing - discussed options to assist with weight loss: combination of diet modification, cardiovascular and strength training exercises   - had an extensive discussion regarding the adverse health consequences related to untreated sleep disordered breathing - specifically discussed the risks for hypertension, coronary artery disease, cardiac dysrhythmias, cerebrovascular disease, and  diabetes - lifestyle modification discussed   - discussed how sleep disruption can increase risk of accidents, particularly when driving - safe driving practices were discussed  Patient Instructions  Given your symptoms, I am concerned that you may have sleep disordered breathing with sleep apnea and possible hypoventilation syndrome. You will need an in lab sleep study for further evaluation. Someone will contact you to schedule this.   We discussed how untreated sleep apnea puts an individual at risk for cardiac arrhthymias, pulm HTN, DM, stroke and increases their risk for daytime accidents. We also briefly reviewed treatment options including weight loss, side sleeping position, oral appliance, CPAP therapy or referral to ENT for possible surgical options  Use caution when driving and pull over if you become sleepy.  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 Healthy weight loss encouraged.    I spent 35 minutes of dedicated to the care of this patient on the date of this encounter to include pre-visit review of records, face-to-face time with the patient discussing conditions above, post visit ordering of testing, clinical documentation with the electronic health record, making appropriate referrals as documented, and communicating necessary findings to members of the patients care team.  Noemi Chapel, NP 10/08/2022  Pt aware and understands NP's role.

## 2022-10-07 NOTE — Patient Instructions (Addendum)
Given your symptoms, I am concerned that you may have sleep disordered breathing with sleep apnea and possible hypoventilation syndrome. You will need an in lab sleep study for further evaluation. Someone will contact you to schedule this.   We discussed how untreated sleep apnea puts an individual at risk for cardiac arrhthymias, pulm HTN, DM, stroke and increases their risk for daytime accidents. We also briefly reviewed treatment options including weight loss, side sleeping position, oral appliance, CPAP therapy or referral to ENT for possible surgical options  Use caution when driving and pull over if you become sleepy.  Follow up in 6 weeks with Katie Huey Scalia,NP to go over sleep study results, or sooner, if needed

## 2022-10-08 ENCOUNTER — Encounter: Payer: Self-pay | Admitting: Nurse Practitioner

## 2022-10-08 ENCOUNTER — Telehealth: Payer: Self-pay

## 2022-10-08 DIAGNOSIS — G4719 Other hypersomnia: Secondary | ICD-10-CM | POA: Insufficient documentation

## 2022-10-08 DIAGNOSIS — R0683 Snoring: Secondary | ICD-10-CM | POA: Insufficient documentation

## 2022-10-08 DIAGNOSIS — R0681 Apnea, not elsewhere classified: Secondary | ICD-10-CM | POA: Insufficient documentation

## 2022-10-08 NOTE — Assessment & Plan Note (Signed)
See above him

## 2022-10-08 NOTE — Telephone Encounter (Signed)
Pharmacy Patient Advocate Encounter   Received notification from CoverMyMeds that prior authorization for Testosterone Cypionate 200MG /ML intramuscular solution is required/requested.   Insurance verification completed.   The patient is insured through [employer] .   Per test claim: PA required; PA submitted to [facility] via CoverMyMeds Key/confirmation #/EOC  [id number] Status is pending

## 2022-10-08 NOTE — Assessment & Plan Note (Signed)
See above

## 2022-10-08 NOTE — Assessment & Plan Note (Signed)
He has snoring, excessive daytime sleepiness, nocturnal apneic events, restless sleep. BMI 60. Epworth 13. Given this,  I am concerned he could have sleep disordered breathing with obstructive sleep apnea as well as obesity hypoventilation syndrome. With his current BMI, he will need an in lab sleep study for further evaluation.    - discussed how weight can impact sleep and risk for sleep disordered breathing - discussed options to assist with weight loss: combination of diet modification, cardiovascular and strength training exercises   - had an extensive discussion regarding the adverse health consequences related to untreated sleep disordered breathing - specifically discussed the risks for hypertension, coronary artery disease, cardiac dysrhythmias, cerebrovascular disease, and diabetes - lifestyle modification discussed   - discussed how sleep disruption can increase risk of accidents, particularly when driving - safe driving practices were discussed  Patient Instructions  Given your symptoms, I am concerned that you may have sleep disordered breathing with sleep apnea and possible hypoventilation syndrome. You will need an in lab sleep study for further evaluation. Someone will contact you to schedule this.   We discussed how untreated sleep apnea puts an individual at risk for cardiac arrhthymias, pulm HTN, DM, stroke and increases their risk for daytime accidents. We also briefly reviewed treatment options including weight loss, side sleeping position, oral appliance, CPAP therapy or referral to ENT for possible surgical options  Use caution when driving and pull over if you become sleepy.  Follow up in 6 weeks with Katie Jasmeen Fritsch,NP to go over sleep study results, or sooner, if needed

## 2022-10-08 NOTE — Assessment & Plan Note (Signed)
BMI 60. Healthy weight loss encouraged.

## 2022-10-14 NOTE — Telephone Encounter (Signed)
Pharmacy Patient Advocate Encounter  Received notification from [facility] that Prior Authorization for Testosterone Cypionate 200MG /ML intramuscular solution has been APPROVED from 10-14-2022 to 10-14-2023   PA #/Case ID/Reference #: [id number]

## 2022-10-15 ENCOUNTER — Ambulatory Visit: Payer: BC Managed Care – PPO

## 2022-10-15 DIAGNOSIS — E291 Testicular hypofunction: Secondary | ICD-10-CM | POA: Diagnosis not present

## 2022-10-15 MED ORDER — TESTOSTERONE CYPIONATE 200 MG/ML IM SOLN
200.0000 mg | Freq: Once | INTRAMUSCULAR | Status: AC
  Administered 2022-10-15: 200 mg via INTRAMUSCULAR

## 2022-10-15 NOTE — Progress Notes (Signed)
Per orders of Mordecai Maes, NP , injection of testosterone 200 mg IM given by Lewanda Rife in [place] pt will have injection in vastus lateralis next [place]. Patient tolerated injection well. Patient will make appointment for 2 week.

## 2022-10-29 ENCOUNTER — Ambulatory Visit: Payer: BC Managed Care – PPO

## 2022-10-29 DIAGNOSIS — E291 Testicular hypofunction: Secondary | ICD-10-CM

## 2022-10-29 MED ORDER — TESTOSTERONE CYPIONATE 200 MG/ML IM SOLN
200.0000 mg | INTRAMUSCULAR | Status: AC
  Administered 2022-10-29: 200 mg via INTRAMUSCULAR

## 2022-10-29 NOTE — Progress Notes (Signed)
Per orders of Mordecai Maes, NP , injection of Testosterone  given by Wilburn Cornelia in left thigh. Patient tolerated injection well. Patient will make appointment for 2 weeks for next injection.

## 2022-10-31 ENCOUNTER — Encounter: Payer: Self-pay | Admitting: Nurse Practitioner

## 2022-10-31 DIAGNOSIS — Z6841 Body Mass Index (BMI) 40.0 and over, adult: Secondary | ICD-10-CM

## 2022-11-02 MED ORDER — ZEPBOUND 2.5 MG/0.5ML ~~LOC~~ SOAJ: 2.5000 mg | SUBCUTANEOUS | 0 refills | Status: DC

## 2022-11-05 ENCOUNTER — Ambulatory Visit: Payer: BC Managed Care – PPO

## 2022-11-12 ENCOUNTER — Ambulatory Visit (INDEPENDENT_AMBULATORY_CARE_PROVIDER_SITE_OTHER): Payer: BC Managed Care – PPO

## 2022-11-12 DIAGNOSIS — E291 Testicular hypofunction: Secondary | ICD-10-CM

## 2022-11-12 MED ORDER — TESTOSTERONE CYPIONATE 200 MG/ML IM SOLN
200.0000 mg | INTRAMUSCULAR | Status: AC
  Administered 2022-11-12: 200 mg via INTRAMUSCULAR

## 2022-11-12 NOTE — Progress Notes (Signed)
Per orders of Mordecai Maes, NP , injection of Testosterone  given by Wilburn Cornelia in right thigh. Patient tolerated injection well. Patient will make appointment for 2 weeks.

## 2022-11-26 ENCOUNTER — Ambulatory Visit: Payer: BC Managed Care – PPO

## 2022-11-26 DIAGNOSIS — E291 Testicular hypofunction: Secondary | ICD-10-CM | POA: Diagnosis not present

## 2022-11-26 MED ORDER — TESTOSTERONE CYPIONATE 200 MG/ML IM SOLN
200.0000 mg | Freq: Once | INTRAMUSCULAR | Status: AC
  Administered 2022-11-26: 200 mg via INTRAMUSCULAR

## 2022-11-26 NOTE — Progress Notes (Signed)
Per orders of Mordecai Maes, NP , injection of testosterone 200 mg IM given by Lewanda Rife in left anterior thigh. Patient tolerated injection well. Patient will make appointment for 2 week.

## 2022-12-09 ENCOUNTER — Other Ambulatory Visit: Payer: Self-pay | Admitting: Nurse Practitioner

## 2022-12-09 ENCOUNTER — Ambulatory Visit (INDEPENDENT_AMBULATORY_CARE_PROVIDER_SITE_OTHER): Payer: BC Managed Care – PPO

## 2022-12-09 DIAGNOSIS — E291 Testicular hypofunction: Secondary | ICD-10-CM | POA: Diagnosis not present

## 2022-12-09 MED ORDER — TESTOSTERONE CYPIONATE 200 MG/ML IM SOLN
200.0000 mg | INTRAMUSCULAR | Status: AC
  Administered 2022-12-09 – 2023-02-17 (×2): 200 mg via INTRAMUSCULAR

## 2022-12-09 NOTE — Progress Notes (Signed)
Patient presented for Testosterone injection given by Wendie Simmer, CMA to right thigh, patient voiced no concerns nor showed any signs of distress during injection.

## 2022-12-18 ENCOUNTER — Ambulatory Visit: Payer: BC Managed Care – PPO | Admitting: Nurse Practitioner

## 2022-12-22 ENCOUNTER — Ambulatory Visit (HOSPITAL_BASED_OUTPATIENT_CLINIC_OR_DEPARTMENT_OTHER): Payer: BC Managed Care – PPO | Attending: Nurse Practitioner | Admitting: Internal Medicine

## 2022-12-22 VITALS — Ht 69.0 in | Wt >= 6400 oz

## 2022-12-22 DIAGNOSIS — Z6841 Body Mass Index (BMI) 40.0 and over, adult: Secondary | ICD-10-CM | POA: Insufficient documentation

## 2022-12-22 DIAGNOSIS — E66813 Obesity, class 3: Secondary | ICD-10-CM | POA: Insufficient documentation

## 2022-12-22 DIAGNOSIS — G4719 Other hypersomnia: Secondary | ICD-10-CM

## 2022-12-22 DIAGNOSIS — R0683 Snoring: Secondary | ICD-10-CM

## 2022-12-22 DIAGNOSIS — G4733 Obstructive sleep apnea (adult) (pediatric): Secondary | ICD-10-CM | POA: Insufficient documentation

## 2022-12-22 DIAGNOSIS — E661 Drug-induced obesity: Secondary | ICD-10-CM | POA: Diagnosis not present

## 2022-12-22 DIAGNOSIS — R0681 Apnea, not elsewhere classified: Secondary | ICD-10-CM

## 2022-12-22 DIAGNOSIS — I493 Ventricular premature depolarization: Secondary | ICD-10-CM | POA: Insufficient documentation

## 2022-12-23 ENCOUNTER — Other Ambulatory Visit (INDEPENDENT_AMBULATORY_CARE_PROVIDER_SITE_OTHER): Payer: BC Managed Care – PPO

## 2022-12-23 DIAGNOSIS — E291 Testicular hypofunction: Secondary | ICD-10-CM | POA: Diagnosis not present

## 2022-12-23 LAB — TESTOSTERONE: Testosterone: 381.48 ng/dL (ref 300.00–890.00)

## 2022-12-23 LAB — CBC
HCT: 45.8 % (ref 39.0–52.0)
Hemoglobin: 14.8 g/dL (ref 13.0–17.0)
MCHC: 32.2 g/dL (ref 30.0–36.0)
MCV: 86.9 fL (ref 78.0–100.0)
Platelets: 342 10*3/uL (ref 150.0–400.0)
RBC: 5.27 Mil/uL (ref 4.22–5.81)
RDW: 15.9 % — ABNORMAL HIGH (ref 11.5–15.5)
WBC: 10 10*3/uL (ref 4.0–10.5)

## 2022-12-23 LAB — PSA: PSA: 0.48 ng/mL (ref 0.10–4.00)

## 2022-12-26 DIAGNOSIS — R0683 Snoring: Secondary | ICD-10-CM | POA: Diagnosis not present

## 2022-12-26 NOTE — Procedures (Signed)
Patient Name: Cameron Thompson, Cameron Thompson Date: 12/22/2022 Gender: Male D.O.B: [birth year] Age (years): 58 Referring Provider: Noemi Chapel NP Height (inches): 69 Interpreting Physician: Jetty Duhamel MD, ABSM Weight (lbs): 416 RPSGT: Ulyess Mort BMI: 61 MRN: [id number] Neck Size: 22.50  CLINICAL INFORMATION The patient is referred for a split night study with BPAP. MEDICATIONS Medications self-administered by patient taken the night of the study : none reported  SLEEP STUDY TECHNIQUE As per the AASM Manual for the Scoring of Sleep and Associated Events v2.3 (April 2016) with a hypopnea requiring 4% desaturations.  The channels recorded and monitored were frontal, central and occipital EEG, electrooculogram (EOG), submentalis EMG (chin), nasal and oral airflow, thoracic and abdominal wall motion, anterior tibialis EMG, snore microphone, electrocardiogram, and pulse oximetry. Bi-level positive airway pressure (BiPAP) was initiated when the patient met split night criteria and was titrated according to treat sleep-disordered breathing.  RESPIRATORY PARAMETERS Diagnostic  Total AHI (/hr): 122.7 RDI (/hr): 122.7 OA Index (/hr): 57.3 CA Index (/hr): 0.0 REM AHI (/hr): 85.7 NREM AHI (/hr): 123.8 Supine AHI (/hr): 120.7 Non-supine AHI (/hr): 124.92 Min O2 Sat (%): 60.0 Mean O2 (%): 83.2 Time below 88% (min): 83.9   Titration  Optimal IPAP Pressure (cm): 26 Optimal EPAP Pressure (cm): 22 AHI at Optimal Pressure (/hr): 4.8 Min O2 at Optimal Pressure (%): 86.0 Sleep % at Optimal (%): 96 Supine % at Optimal (%): 83   SLEEP ARCHITECTURE The study was initiated at 11:17:39 PM and terminated at 5:16:12 AM. The total recorded time was 358.5 minutes. EEG confirmed total sleep time was 293 minutes yielding a sleep efficiency of 81.7%. Sleep onset after lights out was 25.8 minutes with a REM latency of 101.5 minutes. The patient spent 16.7% of the night in stage N1 sleep, 62.3% in stage N2  sleep, 0.0% in stage N3 and 21% in REM. Wake after sleep onset (WASO) was 39.8 minutes. The Arousal Index was 37.1/hour.  LEG MOVEMENT DATA The total Periodic Limb Movements of Sleep (PLMS) were 0. The PLMS index was 0.0 .  CARDIAC DATA The 2 lead EKG demonstrated sinus rhythm. The mean heart rate was 100.0 beats per minute. Other EKG findings include: PVCs.  IMPRESSIONS - Severe obstructive sleep apnea occurred during the diagnostic portion of the study (AHI = 122.7 /hour). - CPAP titration provided inadequate control at tolerated pressure and was changed to BIPAP. - An optimal BIPAP pressure was selected for this patient ( 26 /22 cm of water). Retail BIPAP machines provide a maximum of 25 cwp. - No significant central sleep apnea occurred during the diagnostic portion of the study (CAI = 0.0/hour). - Severe oxygen desaturation was noted during the diagnostic portion of the study (Min O2 = 60.0%). On BIPAP 26/22, minimum O2 satturation was 86% and mean 91.8%. - The patient snored with moderate snoring volume during the diagnostic portion of the study. - EKG findings include PVCs. - Clinically significant periodic limb movements of sleep did not occur during the study.  DIAGNOSIS - Obstructive Sleep Apnea (G47.33)  RECOMMENDATIONS - Trial of BiPAP therapy on 25/22 cm H2O. - Patient wore a Small size [employer] Full Face Simplus mask and heated humidification. - Be careful with alcohol, sedatives and other CNS depressants that may worsen sleep apnea and disrupt normal sleep architecture. - Sleep hygiene should be reviewed to assess factors that may improve sleep quality. - Weight management and regular exercise should be initiated or continued.  [Electronically signed] 12/26/2022 03:55 PM  Xenia Nile Maple Hudson  MD, ABSM Diplomate, American Board of Sleep Medicine NPI: [id number]                          Jetty Duhamel Diplomate, American Board of Sleep  Medicine  ELECTRONICALLY SIGNED ON:  12/26/2022, 3:44 PM  [facility] PH: [phone]   FX: [phone] ACCREDITED BY THE AMERICAN ACADEMY OF SLEEP MEDICINE

## 2022-12-29 ENCOUNTER — Telehealth: Payer: Self-pay | Admitting: Nurse Practitioner

## 2022-12-29 NOTE — Telephone Encounter (Signed)
Very severe sleep apnea. Poorly controlled on CPAP. Adequately managed on BiPAP 25/22. Will schedule follow up and order BiPAP.

## 2022-12-30 ENCOUNTER — Encounter: Payer: Self-pay | Admitting: Nurse Practitioner

## 2022-12-30 DIAGNOSIS — E291 Testicular hypofunction: Secondary | ICD-10-CM

## 2022-12-30 MED ORDER — TESTOSTERONE CYPIONATE 200 MG/ML IM SOLN: 200.0000 mg | INTRAMUSCULAR | 2 refills | Status: DC

## 2023-01-01 NOTE — Telephone Encounter (Signed)
LM for PT to call us for appt per Ms. Clent Ridges.

## 2023-01-04 NOTE — Telephone Encounter (Signed)
Patient has been contacted twice. Closing per office protocol.

## 2023-02-04 ENCOUNTER — Other Ambulatory Visit (HOSPITAL_COMMUNITY): Payer: Self-pay

## 2023-02-10 ENCOUNTER — Ambulatory Visit: Payer: BC Managed Care – PPO | Admitting: Nurse Practitioner

## 2023-02-10 ENCOUNTER — Encounter: Payer: Self-pay | Admitting: Nurse Practitioner

## 2023-02-10 VITALS — BP 136/88 | HR 93 | Temp 97.8°F | Ht 68.75 in | Wt >= 6400 oz

## 2023-02-10 DIAGNOSIS — E559 Vitamin D deficiency, unspecified: Secondary | ICD-10-CM | POA: Diagnosis not present

## 2023-02-10 DIAGNOSIS — Z13220 Encounter for screening for lipoid disorders: Secondary | ICD-10-CM | POA: Diagnosis not present

## 2023-02-10 DIAGNOSIS — Z0000 Encounter for general adult medical examination without abnormal findings: Secondary | ICD-10-CM

## 2023-02-10 DIAGNOSIS — R7303 Prediabetes: Secondary | ICD-10-CM | POA: Insufficient documentation

## 2023-02-10 DIAGNOSIS — E291 Testicular hypofunction: Secondary | ICD-10-CM | POA: Diagnosis not present

## 2023-02-10 LAB — COMPREHENSIVE METABOLIC PANEL
ALT: 54 U/L — ABNORMAL HIGH (ref 0–53)
AST: 39 U/L — ABNORMAL HIGH (ref 0–37)
Albumin: 4.2 g/dL (ref 3.5–5.2)
Alkaline Phosphatase: 76 U/L (ref 39–117)
BUN: 9 mg/dL (ref 6–23)
CO2: 30 meq/L (ref 19–32)
Calcium: 9.2 mg/dL (ref 8.4–10.5)
Chloride: 100 meq/L (ref 96–112)
Creatinine, Ser: 0.74 mg/dL (ref 0.40–1.50)
GFR: 110.44 mL/min (ref >60.00)
Glucose, Bld: 88 mg/dL (ref 70–99)
Potassium: 4.4 meq/L (ref 3.5–5.1)
Sodium: 137 meq/L (ref 135–145)
Total Bilirubin: 0.4 mg/dL (ref 0.2–1.2)
Total Protein: 6.6 g/dL (ref 6.0–8.3)

## 2023-02-10 LAB — CBC
HCT: 45.9 % (ref 39.0–52.0)
Hemoglobin: 15 g/dL (ref 13.0–17.0)
MCHC: 32.7 g/dL (ref 30.0–36.0)
MCV: 86 fL (ref 78.0–100.0)
Platelets: 323 10*3/uL (ref 150.0–400.0)
RBC: 5.33 Mil/uL (ref 4.22–5.81)
RDW: 16.2 % — ABNORMAL HIGH (ref 11.5–15.5)
WBC: 9.1 10*3/uL (ref 4.0–10.5)

## 2023-02-10 LAB — LIPID PANEL
Cholesterol: 204 mg/dL — ABNORMAL HIGH (ref 0–200)
HDL: 38.8 mg/dL — ABNORMAL LOW (ref >39.00)
LDL Cholesterol: 119 mg/dL — ABNORMAL HIGH (ref 0–99)
NonHDL: 165.19
Total CHOL/HDL Ratio: 5
Triglycerides: 233 mg/dL — ABNORMAL HIGH (ref 0.0–149.0)
VLDL: 46.6 mg/dL — ABNORMAL HIGH (ref 0.0–40.0)

## 2023-02-10 LAB — TSH: TSH: 2.45 u[IU]/mL (ref 0.35–5.50)

## 2023-02-10 LAB — HEMOGLOBIN A1C: Hgb A1c MFr Bld: 6.7 % — ABNORMAL HIGH (ref 4.6–6.5)

## 2023-02-10 LAB — VITAMIN D 25 HYDROXY (VIT D DEFICIENCY, FRACTURES): VITD: 12.16 ng/mL — ABNORMAL LOW (ref 30.00–100.00)

## 2023-02-10 NOTE — Assessment & Plan Note (Signed)
History of same.  Patient was on 100 mg IM every 14 days of testosterone.  Has been off of it for several months we will restart medication.  Patient to make a nurse visit when he leaves today.  Plan to recheck testosterone labs in 3 months

## 2023-02-10 NOTE — Assessment & Plan Note (Signed)
History of same.  Pending A1c continue working lifestyle modification and has lost weight since last office visit

## 2023-02-10 NOTE — Progress Notes (Signed)
Established Patient Office Visit  Subjective   Patient ID: Cameron Thompson, male    DOB: [birth year]  Age: 42 y.o. MRN: [id number]  Chief Complaint  Patient presents with   Annual Exam    Would like to discuss prevnar vaccine.     HPI  for complete physical and follow up of chronic conditions.   Hypogonadism:  patient is currently on testosterone 200mg  IM recently was got PSA and testostrone checked. He  had not had it since the ed of novemeber. Felt that it was helped with mood and every  Asthma: Has not had to use the inhaler thus far. States that pollen and spring is his worst time of the year   Immunizations: -Tetanus: Completed in 2024 -Influenza: 09/23/2022 -Shingles: too young -Pneumonia: too young  Diet: Fair diet. He is eating 3 times a day with some snacks. He will drink diet green tea. Some water Exercise: No regular exercise.  Eye exam: PRN Dental exam: Completes semi-annually    Colonoscopy: Too young, currently average risk Lung Cancer Screening: NA   PSA: UTD, too young for prostate cancer screening   Sleep: going to bed around 11-1a. Get up at 730 on work days or 930 on certain days. Does not feel rested. Does have OSA and has not had a CPAP.  States that with employment he is having a hard time with talkin to the customer. He is getting cussed out and yelled out. States that he does not have SI. States that they have offered EAP and he is going to try. He does not want any medication at this juncture      02/10/2023   11:09 AM 09/23/2022    2:53 PM 08/05/2022    4:13 PM  PHQ9 SCORE ONLY  PHQ-9 Total Score 10 3 8        02/10/2023   11:09 AM 09/23/2022    2:53 PM 08/05/2022    4:14 PM  GAD 7 : Generalized Anxiety Score  Nervous, Anxious, on Edge 2 0 0  Control/stop worrying 2 0 0  Worry too much - different things 1  1  Trouble relaxing 2 1 1   Restless 0 0 0  Easily annoyed or irritable 0 0 0  Afraid - awful might happen 1 0 0  Total GAD 7  Score 8  2  Anxiety Difficulty Not difficult at all Not difficult at all Not difficult at all            Review of Systems  Constitutional:  Negative for chills and fever.  Respiratory:  Negative for shortness of breath.   Cardiovascular:  Negative for chest pain and leg swelling.  Gastrointestinal:  Negative for abdominal pain, blood in stool, constipation, diarrhea, nausea and vomiting.       BM daily   Genitourinary:  Negative for dysuria and hematuria.  Neurological:  Negative for tingling and headaches.  Psychiatric/Behavioral:  Negative for hallucinations and suicidal ideas.       Objective:     BP 136/88   Pulse 93   Temp 97.8 F (36.6 C) (Oral)   Ht 5' 8.75" (1.746 m)   Wt (!) 400 lb 9.6 oz (181.7 kg)   SpO2 96%   BMI 59.59 kg/m  BP Readings from Last 3 Encounters:  02/10/23 136/88  10/07/22 (!) 110/90  09/23/22 122/82   Wt Readings from Last 3 Encounters:  02/10/23 (!) 400 lb 9.6 oz (181.7 kg)  12/22/22 (!) 416 lb (188.7 kg)  10/07/22 (!) 408 lb 9.6 oz (185.3 kg)   SpO2 Readings from Last 3 Encounters:  02/10/23 96%  10/07/22 97%  09/23/22 95%      Physical Exam Vitals and nursing note reviewed.  Constitutional:      Appearance: Normal appearance.  HENT:     Right Ear: Tympanic membrane, ear canal and external ear normal.     Left Ear: Tympanic membrane, ear canal and external ear normal.     Mouth/Throat:     Mouth: Mucous membranes are moist.     Pharynx: Oropharynx is clear.  Eyes:     Extraocular Movements: Extraocular movements intact.     Pupils: Pupils are equal, round, and reactive to light.  Cardiovascular:     Rate and Rhythm: Normal rate and regular rhythm.     Pulses: Normal pulses.     Heart sounds: Normal heart sounds.  Pulmonary:     Effort: Pulmonary effort is normal.     Breath sounds: Normal breath sounds.  Abdominal:     General: Bowel sounds are normal. There is no distension.     Palpations: There is no mass.      Tenderness: There is no abdominal tenderness.     Hernia: No hernia is present.  Musculoskeletal:     Right lower leg: No edema.     Left lower leg: No edema.  Lymphadenopathy:     Cervical: No cervical adenopathy.  Skin:    General: Skin is warm.  Neurological:     General: No focal deficit present.     Mental Status: He is alert.     Deep Tendon Reflexes:     Reflex Scores:      Bicep reflexes are 2+ on the right side and 2+ on the left side.      Patellar reflexes are 2+ on the right side and 2+ on the left side.    Comments: Bilateral upper and lower extremity strength 5/5  Psychiatric:        Mood and Affect: Mood normal.        Behavior: Behavior normal.        Thought Content: Thought content normal.        Judgment: Judgment normal.      No results found for any visits on 02/10/23.    The 10-year ASCVD risk score (Arnett DK, et al., 2019) is: 2.6%    Assessment & Plan:   Problem List Items Addressed This Visit       Endocrine   Hypogonadism in male   History of same.  Patient was on 100 mg IM every 14 days of testosterone.  Has been off of it for several months we will restart medication.  Patient to make a nurse visit when he leaves today.  Plan to recheck testosterone labs in 3 months        Other   Preventative health care - Primary   Did discuss age-appropriate immunizations and screening exams.  Did review patient's personal, surgical, social, family histories.  Patient is up-to-date on age-appropriate vaccinations.  Patient's too young for CRC screening or prostate cancer screening.  Patient was given information at discharge about preventative healthcare maintenance with anticipatory guidance.      Relevant Orders   CBC   Comprehensive metabolic panel   TSH   Vitamin D deficiency   History of same pending vitamin D      Relevant Orders   VITAMIN D 25 Hydroxy (Vit-D Deficiency, Fractures)  Morbid obesity (HCC)   Pending A1c, lipid panel, TSH.   He lost a modifications.      Relevant Orders   Lipid panel   Prediabetes   History of same.  Pending A1c continue working lifestyle modification and has lost weight since last office visit      Relevant Orders   Hemoglobin A1c   Other Visit Diagnoses       Screening for lipid disorders       Relevant Orders   Lipid panel       Return in about 3 months (around 05/11/2023) for lab only visit .    Audria Nine, NP

## 2023-02-10 NOTE — Assessment & Plan Note (Signed)
Pending A1c, lipid panel, TSH.  He lost a modifications.

## 2023-02-10 NOTE — Assessment & Plan Note (Signed)
Did discuss age-appropriate immunizations and screening exams.  Did review patient's personal, surgical, social, family histories.  Patient is up-to-date on age-appropriate vaccinations.  Patient's too young for CRC screening or prostate cancer screening.  Patient was given information at discharge about preventative healthcare maintenance with anticipatory guidance.

## 2023-02-10 NOTE — Patient Instructions (Signed)
Nice to see you today I will be in touch with the labs once I have them Get a nurse visit to start on the testosterone Will need a 3 month lab follow up prior to 930am for testosterone recheck

## 2023-02-10 NOTE — Assessment & Plan Note (Signed)
History of same pending vitamin D

## 2023-02-11 ENCOUNTER — Encounter: Payer: Self-pay | Admitting: Nurse Practitioner

## 2023-02-11 ENCOUNTER — Other Ambulatory Visit: Payer: Self-pay | Admitting: Nurse Practitioner

## 2023-02-11 DIAGNOSIS — E119 Type 2 diabetes mellitus without complications: Secondary | ICD-10-CM

## 2023-02-11 MED ORDER — OZEMPIC (0.25 OR 0.5 MG/DOSE) 2 MG/3ML ~~LOC~~ SOPN: 0.2500 mg | PEN_INJECTOR | SUBCUTANEOUS | 0 refills | Status: DC

## 2023-02-12 ENCOUNTER — Telehealth: Payer: Self-pay

## 2023-02-12 ENCOUNTER — Other Ambulatory Visit (HOSPITAL_COMMUNITY): Payer: Self-pay

## 2023-02-12 NOTE — Telephone Encounter (Signed)
He is  diabetic

## 2023-02-12 NOTE — Telephone Encounter (Signed)
Pharmacy Patient Advocate Encounter   Received notification from CoverMyMeds that prior authorization for Ozempic (0.25 or 0.5 MG/DOSE) 2MG /3ML pen-injectors is required/requested.   Insurance verification completed.   The patient is insured through [facility] .   Per test claim: This is covered exclusively with a diagnosis of type 2 diabetes

## 2023-02-12 NOTE — Telephone Encounter (Signed)
Left message on VM for pt to call the office and schedule OV with MAtt Cable, NP on or after May 12, 2023 for 3 month DM F/U.

## 2023-02-17 ENCOUNTER — Ambulatory Visit: Payer: BC Managed Care – PPO

## 2023-02-17 ENCOUNTER — Other Ambulatory Visit: Payer: Self-pay | Admitting: Nurse Practitioner

## 2023-02-17 DIAGNOSIS — E291 Testicular hypofunction: Secondary | ICD-10-CM

## 2023-02-17 DIAGNOSIS — E119 Type 2 diabetes mellitus without complications: Secondary | ICD-10-CM | POA: Insufficient documentation

## 2023-02-17 MED ORDER — TESTOSTERONE CYPIONATE 200 MG/ML IM SOLN: 200.0000 mg | Freq: Once | INTRAMUSCULAR | Status: AC

## 2023-02-17 NOTE — Progress Notes (Signed)
 Per orders of Winthrop Hawks, NP , injection of testosterone  200 mg IM given by Claretha Crocker in left anterior thigh. Patient tolerated injection well.Pt waited 10 mins after injection with no problems or complaints. Patient will make appointment for 2 week.

## 2023-02-17 NOTE — Telephone Encounter (Signed)
 Chart has been updated with correct dx

## 2023-02-18 ENCOUNTER — Other Ambulatory Visit (HOSPITAL_COMMUNITY): Payer: Self-pay

## 2023-02-18 ENCOUNTER — Encounter: Payer: Self-pay | Admitting: *Deleted

## 2023-02-18 ENCOUNTER — Ambulatory Visit: Payer: BC Managed Care – PPO | Admitting: Family Medicine

## 2023-02-18 ENCOUNTER — Encounter: Payer: Self-pay | Admitting: Family Medicine

## 2023-02-18 ENCOUNTER — Telehealth: Payer: Self-pay

## 2023-02-18 ENCOUNTER — Ambulatory Visit (INDEPENDENT_AMBULATORY_CARE_PROVIDER_SITE_OTHER)
Admission: RE | Admit: 2023-02-18 | Discharge: 2023-02-18 | Disposition: A | Discharge status: Home / Self Care | Payer: BC Managed Care – PPO | Source: Ambulatory Visit | Attending: Family Medicine | Admitting: Family Medicine

## 2023-02-18 VITALS — BP 138/90 | HR 94 | Temp 97.7°F | Ht 68.75 in | Wt >= 6400 oz

## 2023-02-18 DIAGNOSIS — M545 Low back pain, unspecified: Secondary | ICD-10-CM | POA: Diagnosis not present

## 2023-02-18 DIAGNOSIS — M4726 Other spondylosis with radiculopathy, lumbar region: Secondary | ICD-10-CM | POA: Diagnosis not present

## 2023-02-18 MED ORDER — PREDNISONE 10 MG PO TABS: ORAL_TABLET | ORAL | 0 refills | Status: DC

## 2023-02-18 NOTE — Progress Notes (Signed)
 Subjective:    Patient ID: Cameron Thompson, male    DOB: 07-Aug-1980, 43 y.o.   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   This is different from usual back pain  Started Saturday  Stood up from sitting at work  Left low back / above buttock  Is both dull and sharp  Deep /achey at times  Shot down to the leg / medial knee and straight to big toe   Worse to bend forward  Also pivot / twist  Not relieved by walking unfortunately   No neuro changes  No numb or weak  No sudden los of bowel or bladder control   Can stand still for brief periods  Hurts until he falls asleep at night      Tried  Methocarbamol  Naproxen    Was supposed to do PT in past for back pain in past  They never found a spot for him to get in   Has recently started walking on treadmill   Lab Results  Component Value Date   HGBA1C 6.7 (H) 02/10/2023   Imaging today  DG Lumbar Spine 2-3 Views Result Date: 02/18/2023 CLINICAL DATA:  Acute on chronic low back pain, worse in the left with radiculopathy EXAM: LUMBAR SPINE - 2-3 VIEW COMPARISON:  CT 03/14/2016 FINDINGS: Underpenetrated radiographs. No evidence of acute fracture or traumatic listhesis. Intervertebral disc space height is maintained. Mild age commensurate spondylosis. Mild facet arthropathy at L4-L5 and L5-S1. IMPRESSION: 1. No acute fracture or traumatic listhesis. 2. Age commensurate mild spondylosis and facet arthropathy. Intervertebral disc space height is maintained. Electronically Signed   By: Norman Gatlin M.D.   On: 02/18/2023 09:06      Patient Active Problem List   Diagnosis Date Noted   Controlled type 2 diabetes  mellitus without complication, without long-term current use of insulin (HCC) 02/17/2023   Morbid obesity (HCC) 02/10/2023   Prediabetes 02/10/2023   Excessive daytime sleepiness 10/08/2022   Loud snoring 10/08/2022   Witnessed apneic spells 10/08/2022   Hypogonadism in male 09/23/2022   Non-restorative sleep 08/05/2022   Erectile dysfunction 08/05/2022   History of gout 08/05/2022   Fatigue 08/05/2022   Change in vision- with headaches.  03/19/2020   Dizzinesses with headaches.  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 [facility]) 01/27/2019   Preventative health care 01/27/2019   Past Medical History:  Diagnosis Date   Allergy    Asthma    pollen season   Gout  History of kidney stones    Obese    Vitamin D  deficiency    Past Surgical History:  Procedure Laterality Date   NO PAST SURGERIES     NO PAST SURGERIES     VENTRAL HERNIA REPAIR N/A 03/03/2019   Procedure: HERNIA REPAIR UMBILICAL ADULT;  Surgeon: Dessa Reyes ORN, MD;  Location: [facility] ORS;  Service: General;  Laterality: N/A;   Social History   Tobacco Use   Smoking status: Never   Smokeless tobacco: Never  Vaping Use   Vaping status: Never Used  Substance Use Topics   Alcohol use: Yes    Comment: occasionally   Drug use: No   Family History  Problem Relation Age of Onset   Pulmonary embolism Mother    Cancer Maternal Grandmother        lung   Diabetes  Paternal Grandmother    Allergies  Allergen Reactions   Kiwi Extract Anaphylaxis   Current Outpatient Medications on File Prior to Visit  Medication Sig Dispense Refill   albuterol  (VENTOLIN  HFA) 108 (90 Base) MCG/ACT inhaler Inhale 2 puffs into the lungs every 6 (six) hours as needed for wheezing or shortness of breath. 8 g 0   naproxen  (NAPROSYN ) 500 MG tablet Take 1 tablet (500 mg total) by mouth 2 (two) times daily. (Patient taking differently: Take 500 mg by mouth 2 (two) times daily as needed.) 20 tablet 0   testosterone  cypionate (DEPOTESTOSTERONE CYPIONATE) 200 MG/ML injection Inject 1 mL (200 mg total) into the muscle every 14 (fourteen) days. 2 mL 2   Semaglutide ,0.25 or 0.5MG /DOS, (OZEMPIC , 0.25 OR 0.5 MG/DOSE,) 2 MG/3ML SOPN Inject 0.25 mg into the skin once a week. (Patient not taking: Reported on 02/18/2023) 3 mL 0   [DISCONTINUED] mupirocin  nasal ointment (BACTROBAN  NASAL) 2 % Apply small amount to right nostril twice daily as directed. (Patient not taking: Reported on 03/03/2019) 10 g 0   Current Facility-Administered Medications on File Prior to Visit  Medication Dose Route Frequency Provider Last Rate Last Admin   testosterone  cypionate (DEPOTESTOSTERONE CYPIONATE) injection 200 mg  200 mg Intramuscular Q14 Days Wendee Lynwood HERO, NP   200 mg at 10/29/22 1138   testosterone  cypionate (DEPOTESTOSTERONE CYPIONATE) injection 200 mg  200 mg Intramuscular Q14 Days Wendee Lynwood HERO, NP   200 mg at 11/12/22 1123   testosterone  cypionate (DEPOTESTOSTERONE CYPIONATE) injection 200 mg  200 mg Intramuscular Q14 Days Wendee Lynwood HERO, NP   200 mg at 02/17/23 1122   testosterone  cypionate (DEPOTESTOSTERONE CYPIONATE) injection 200 mg  200 mg Intramuscular Once Wendee Lynwood HERO, NP        Review of Systems     Objective:   Physical Exam Constitutional:      General: He is not in acute distress.    Appearance: Normal appearance. He is well-developed. He is obese. He is not ill-appearing or  diaphoretic.  HENT:     Head: Normocephalic and atraumatic.  Eyes:     General: No scleral icterus.    Conjunctiva/sclera: Conjunctivae normal.     Pupils: Pupils are equal, round, and reactive to light.  Cardiovascular:     Rate and Rhythm: Normal rate and regular rhythm.  Pulmonary:     Effort: Pulmonary effort is normal.     Breath sounds: Normal breath sounds. No wheezing or rales.  Abdominal:     General: Bowel sounds are normal. There is no distension.     Palpations: Abdomen is soft.     Tenderness:  There is no abdominal tenderness.  Musculoskeletal:        General: Tenderness present.     Cervical back: Normal range of motion and neck supple.     Lumbar back: Spasms and tenderness present. No edema or bony tenderness. Decreased range of motion. Negative right straight leg raise test and negative left straight leg raise test.     Comments: No obv scoliosis  No spinous process tenderness Tender over left lumbar musculature and SI area  Worse with extension and right lateral bend  Neg slr for leg pain  No neuro changes    Lymphadenopathy:     Cervical: No cervical adenopathy.  Skin:    General: Skin is warm and dry.     Coloration: Skin is not pale.     Findings: No erythema or rash.  Neurological:     Mental Status: He is alert.     Cranial Nerves: No cranial nerve deficit.     Sensory: No sensory deficit.     Motor: No weakness, atrophy or abnormal muscle tone.     Coordination: Coordination normal.     Deep Tendon Reflexes: Reflexes are normal and symmetric. Reflexes normal.     Comments: Negative SLR  Psychiatric:        Mood and Affect: Mood normal.           Assessment & Plan:   Problem List Items Addressed This Visit       Other   Morbid obesity (HCC)   Worse- weight is up 8 lb Reviewed pcp notes and plan  Discussed how this problem influences overall health and the risks it imposes  Reviewed plan for weight loss with lower calorie diet (via  better food choices (lower glycemic and portion control) along with exercise building up to or more than 30 minutes 5 days per week including some aerobic activity and strength training   Pt will be starting ozempic  soon for this and dm2  Hope it will be helpful        Lumbar back pain - Primary   Acute on chronic - this time worse on left with radiation to LLE  Suspect sciatic nerve involvement  Reassuring exam/ pain worse with ext and right lat bend  Reviewed notes from pcp- did not do PT because he could not get it scheduled (this will probably be important in the future)   Today prednisoene 30 mg taper prescription (reviewed side effects in detail) Has methocarbamol  Given sciatica rehab handout   [place] of LS ordered given failure of consv treatment  Noted some mild spondylosis and facet arthropathy / disk space ht it mt   Will discuss PT   Working on weight loss and about to start ozempic  for dm      Relevant Medications   predniSONE  (DELTASONE ) 10 MG tablet   Other Relevant Orders   DG Lumbar Spine 2-3 Views (Completed)

## 2023-02-18 NOTE — Telephone Encounter (Signed)
He has not

## 2023-02-18 NOTE — Assessment & Plan Note (Addendum)
 Acute on chronic - this time worse on left with radiation to LLE  Suspect sciatic nerve involvement  Reassuring exam/ pain worse with ext and right lat bend  Reviewed notes from pcp- did not do PT because he could not get it scheduled (this will probably be important in the future)   Today prednisoene 30 mg taper prescription (reviewed side effects in detail) Has methocarbamol  Given sciatica rehab handout   [place] of LS ordered given failure of consv treatment  Noted some mild spondylosis and facet arthropathy / disk space ht it mt   Will discuss PT   Working on weight loss and about to start ozempic  for dm

## 2023-02-18 NOTE — Assessment & Plan Note (Addendum)
 Worse- weight is up 8 lb Reviewed pcp notes and plan  Discussed how this problem influences overall health and the risks it imposes  Reviewed plan for weight loss with lower calorie diet (via better food choices (lower glycemic and portion control) along with exercise building up to or more than 30 minutes 5 days per week including some aerobic activity and strength training   Pt will be starting ozempic  soon for this and dm2  Hope it will be helpful

## 2023-02-18 NOTE — Patient Instructions (Signed)
 Alternate heat and ice on back for 30 minutes   Try the methocarbamol again for spasm  Take prednisone  as directed (hyper, hungry, blood sugar will go up temporarily)    If suddenly worse - call us  / go to ER if severe   Xray today   Plan to follow

## 2023-02-18 NOTE — Telephone Encounter (Signed)
 Began submitting PA request for Ozempic  and ran across a question regarding metformin, has pt had a trial or intolerance to metformin? If not, PA is likely to get denied. Thanks.

## 2023-02-18 NOTE — Telephone Encounter (Signed)
 Pharmacy Patient Advocate Encounter   Received notification from Pt Calls Messages that prior authorization for Ozempic  (0.25 or 0.5 MG/DOSE) 2MG /3ML pen-injectors is required/requested.   Insurance verification completed.   The patient is insured through [id number] .   Per test claim: PA required; PA started via CoverMyMeds. KEY [id number] . Please see clinical question(s) below that I am not finding the answer to in her chart and advise.    RELAYED TO CLINICAL CARE TEAM IN ORIGINAL REQUEST MESSAGE

## 2023-02-23 NOTE — Telephone Encounter (Signed)
Unless the patient has a contraindication to metformin or a clinical rationale explaining why they cannot trial it, the PA will not be submitted, as it would be denied. Thank you

## 2023-02-24 NOTE — Telephone Encounter (Signed)
Can we let the patient know that he will have to try and be on or try and fail metformin prior to being able to use one of the injectable medications.  We can see if he is willing to do metformin

## 2023-02-24 NOTE — Telephone Encounter (Signed)
Left voicemail for patient to call the office back.

## 2023-02-25 NOTE — Telephone Encounter (Signed)
Left voicemail for patient to call the office back.

## 2023-03-02 NOTE — Telephone Encounter (Signed)
Left voicemail for patient to call the office back.   3x sending letter to pt address on file.

## 2023-03-03 ENCOUNTER — Ambulatory Visit: Payer: BC Managed Care – PPO

## 2023-04-20 ENCOUNTER — Telehealth: Payer: Self-pay

## 2023-04-20 NOTE — Telephone Encounter (Signed)
-----   Message from Alvina Chou sent at 04/20/2023  7:25 AM EDT ----- Regarding: FW: Lab orders for Wed, 4.30.25 Please contact patient, thanks ----- Message ----- From: Eden Emms, NP Sent: 04/19/2023   4:17 PM EDT To: Alvina Chou; [employer] Subject: RE: Lab orders for Wed, 4.30.25                This was suppose to be an office visit ----- Message ----- From: Alvina Chou Sent: 04/19/2023   4:00 PM EDT To: Eden Emms, NP Subject: Lab orders for Wed, 4.30.25                    Lab orders, thanks

## 2023-04-20 NOTE — Telephone Encounter (Signed)
 Contacted pt. Left detailed voicemail and my chart message for patient to call the office back.

## 2023-04-23 ENCOUNTER — Ambulatory Visit (INDEPENDENT_AMBULATORY_CARE_PROVIDER_SITE_OTHER): Payer: Self-pay

## 2023-04-23 ENCOUNTER — Ambulatory Visit
Admission: EM | Admit: 2023-04-23 | Discharge: 2023-04-23 | Disposition: A | Discharge status: Home / Self Care | Payer: Self-pay | Attending: Physician Assistant | Admitting: Physician Assistant

## 2023-04-23 ENCOUNTER — Encounter: Payer: Self-pay | Admitting: Emergency Medicine

## 2023-04-23 DIAGNOSIS — R079 Chest pain, unspecified: Secondary | ICD-10-CM

## 2023-04-23 DIAGNOSIS — J45901 Unspecified asthma with (acute) exacerbation: Secondary | ICD-10-CM | POA: Insufficient documentation

## 2023-04-23 DIAGNOSIS — R0602 Shortness of breath: Secondary | ICD-10-CM | POA: Insufficient documentation

## 2023-04-23 DIAGNOSIS — J40 Bronchitis, not specified as acute or chronic: Secondary | ICD-10-CM | POA: Insufficient documentation

## 2023-04-23 LAB — RESP PANEL BY RT-PCR (FLU A&B, COVID) ARPGX2
Influenza A by PCR: NEGATIVE
Influenza B by PCR: NEGATIVE
SARS Coronavirus 2 by RT PCR: NEGATIVE

## 2023-04-23 MED ORDER — ALBUTEROL SULFATE HFA 108 (90 BASE) MCG/ACT IN AERS
1.0000 | INHALATION_SPRAY | Freq: Four times a day (QID) | RESPIRATORY_TRACT | 0 refills | Status: AC | PRN
Start: 2023-04-23 — End: ?

## 2023-04-23 MED ORDER — IPRATROPIUM-ALBUTEROL 0.5-2.5 (3) MG/3ML IN SOLN
3.0000 mL | Freq: Once | RESPIRATORY_TRACT | Status: AC
  Administered 2023-04-23: 3 mL via RESPIRATORY_TRACT

## 2023-04-23 MED ORDER — PREDNISONE 10 MG PO TABS: ORAL_TABLET | ORAL | 0 refills | Status: DC

## 2023-04-23 NOTE — ED Provider Notes (Signed)
 [facility]    CSN: [id number] Arrival date & time: 04/23/23  0903      History   Chief Complaint Chief Complaint  Patient presents with   Cough   Shortness of Breath    HPI Cameron Thompson is a 43 y.o. male with history of asthma, allergies, gout, obesity, obstructive sleep apnea, and type 2 diabetes.  Presents today for dry cough, congestion, shortness of breath and chest tightness since yesterday.  Denies fever, sore throat, or nasal congestion.  Denies any sick contacts or known exposure to COVID or flu. Patient says he could not find his albuterol inhaler at home. Patient says he has a CPAP at home but does not have everything for it and sleep medicine has not contacted him with further instructions. No history of PE or DVT.  HPI  Past Medical History:  Diagnosis Date   Allergy    Asthma    pollen season   Gout    History of kidney stones    Obese    Vitamin D deficiency     Patient Active Problem List   Diagnosis Date Noted   Controlled type 2 diabetes mellitus without complication, without long-term current use of insulin (HCC) 02/17/2023   Morbid obesity (HCC) 02/10/2023   Prediabetes 02/10/2023   Excessive daytime sleepiness 10/08/2022   Loud snoring 10/08/2022   Witnessed apneic spells 10/08/2022   Hypogonadism in male 09/23/2022   Non-restorative sleep 08/05/2022   Erectile dysfunction 08/05/2022   History of gout 08/05/2022   Fatigue 08/05/2022   Change in vision- with headaches.  03/19/2020   Dizzinesses with headaches.  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 Authorizing Provider  albuterol (VENTOLIN HFA) 108 (90 Base) MCG/ACT inhaler Inhale 1-2 puffs into the lungs every 6 (six) hours as needed for wheezing or shortness of breath. 04/23/23  Yes Shirlee Latch, PA-C  predniSONE (DELTASONE) 10 MG tablet Take 4 tabs po x 2 days 3 tabs x 2 days, 2 tabs x 2 days, 1 tab x 2 days 04/23/23  Yes Eusebio Friendly B, PA-C  Semaglutide,0.25 or 0.5MG /DOS, (OZEMPIC, 0.25 OR 0.5 MG/DOSE,) 2 MG/3ML SOPN Inject 0.25 mg into the skin once a week. Patient not taking: Reported on 02/18/2023 02/11/23   Eden Emms, NP  testosterone cypionate (DEPOTESTOSTERONE CYPIONATE) 200 MG/ML injection Inject 1 mL (200 mg total) into the muscle every 14 (fourteen) days. 12/30/22  Eden Emms, NP  mupirocin nasal ointment (BACTROBAN NASAL) 2 % Apply small amount to right nostril twice daily as directed. Patient not taking: Reported on 03/03/2019 01/27/19 09/11/19  Flinchum, Eula Fried, FNP    Family History Family History  Problem Relation Age of Onset   Pulmonary embolism Mother    Cancer Maternal Grandmother        lung   Diabetes Paternal Grandmother     Social History Social History   Tobacco Use   Smoking status: Never   Smokeless tobacco: Never  Vaping Use   Vaping status: Never Used  Substance Use Topics   Alcohol use: Yes    Comment: occasionally   Drug use:  No     Allergies   Kiwi extract   Review of Systems Review of Systems  Constitutional:  Positive for fatigue. Negative for fever.  HENT:  Positive for congestion. Negative for rhinorrhea, sinus pressure, sinus pain and sore throat.   Respiratory:  Positive for cough and shortness of breath.   Cardiovascular:  Positive for chest pain. Negative for palpitations.  Gastrointestinal:  Negative for abdominal pain, diarrhea, nausea and vomiting.  Musculoskeletal:  Negative for myalgias.  Neurological:  Positive for headaches. Negative for weakness and light-headedness.  Hematological:  Negative for adenopathy.     Physical Exam Triage Vital Signs ED Triage Vitals  Encounter Vitals Group     BP 04/23/23 0916 (!) 138/90     Systolic BP Percentile --      Diastolic BP Percentile --      Pulse Rate 04/23/23 0916 (!) 105     Resp 04/23/23 0916 20     Temp 04/23/23 0916 (!) 97.5 F (36.4 C)     Temp Source 04/23/23 0916 Oral     SpO2 04/23/23 0916 94 %     Weight 04/23/23 0914 (!) 410 lb 7.9 oz (186.2 kg)     Height 04/23/23 0914 5\' 8"  (1.727 m)     Head Circumference --      Peak Flow --      Pain Score 04/23/23 0914 3     Pain Loc --      Pain Education --      Exclude from Growth Chart --    No data found.  Updated Vital Signs BP (!) 155/95 (BP Location: Right Arm)   Pulse 97   Temp (!) 97.5 F (36.4 C) (Oral)   Resp 18   Ht 5\' 8"  (1.727 m)   Wt (!) 410 lb 7.9 oz (186.2 kg)   SpO2 93%   BMI 62.42 kg/m      Physical Exam Vitals and nursing note reviewed.  Constitutional:      General: He is in acute distress.     Appearance: Normal appearance. He is well-developed. He is obese. He is not ill-appearing.  HENT:     Head: Normocephalic and atraumatic.     Nose: Nose normal.     Mouth/Throat:     Mouth: Mucous membranes are moist.     Pharynx: Oropharynx is clear.  Eyes:     General: No scleral icterus.    Conjunctiva/sclera: Conjunctivae normal.   Cardiovascular:     Rate and Rhythm: Normal rate and regular rhythm.     Heart sounds: Normal heart sounds.  Pulmonary:     Effort: Respiratory distress present.     Breath sounds: Normal breath sounds.     Comments: Mild respiratory distress.  Patient is able to  say a few words or short sentences but then pauses to take a deep breath multiple times throughout triage.  Reduced lung sounds throughout. Musculoskeletal:     Cervical back: Neck supple.  Skin:    General: Skin is warm and dry.     Capillary Refill: Capillary refill takes less than 2 seconds.  Neurological:     General: No focal deficit present.     Mental Status: He is alert. Mental status is at baseline.     Motor: No weakness.     Gait: Gait normal.  Psychiatric:        Mood and Affect: Mood normal.        Behavior: Behavior normal.      UC Treatments / Results  Labs (all labs ordered are listed, but only abnormal results are displayed) Labs Reviewed  RESP PANEL BY RT-PCR (FLU A&B, COVID) [id number]    EKG   Radiology DG Chest 2 View Result Date: 04/23/2023 CLINICAL DATA:  Chest pain, cough and shortness of breath since yesterday. Back pain and headache since last night. EXAM: CHEST - 2 VIEW COMPARISON:  12/15/2021 FINDINGS: Normal sized heart. Clear lungs with normal vascularity. Mild peribronchial thickening minimal thoracic spine degenerative changes. Stable minimal scoliosis. IMPRESSION: Mild bronchitic changes. Electronically Signed   By: Beckie Salts M.D.   On: 04/23/2023 09:58    Procedures ED EKG  Date/Time: 04/23/2023 9:35 AM  Performed by: Shirlee Latch, PA-C Authorized by: Shirlee Latch, PA-C   Previous ECG:    Previous ECG:  Compared to current   Comparison ECG info:  No significant changes Interpretation:    Interpretation: abnormal   Rate:    ECG rate:  95   ECG rate assessment: normal   Rhythm:    Rhythm: sinus rhythm   Ectopy:    Ectopy: none   QRS:    QRS axis:  Right   QRS  intervals:  Normal   QRS conduction: normal   ST segments:    ST segments:  Normal T waves:    T waves: normal   Comments:     Sinus rhythm, right axis deviation, pulmonary disease pattern, right ventricular hypertrophy.  No significant changes when compared to EKG from September 11, 2019.  (including critical care time)  Medications Ordered in UC Medications  ipratropium-albuterol (DUONEB) 0.5-2.5 (3) MG/3ML nebulizer solution 3 mL (3 mLs Nebulization Given 04/23/23 0949)    Initial Impression / Assessment and Plan / UC Course  I have reviewed the triage vital signs and the nursing notes.  Pertinent labs & imaging results that were available during my care of the patient were reviewed by me and considered in my medical decision making (see chart for details).   43 year old obese male with history of asthma, allergies, obstructive sleep apnea, hypertension and diabetes presents for fatigue, cough, shortness of breath and chest pain since yesterday.  No history of PE or DVT.  Denies pleuritic pain.  BP 138/90.  Currently afebrile.  Pulse slightly elevated at 105 bpm.  Overall well-appearing.  Mildly distressed.  Able to speak a few words in short sentences before taking a deep gasping breath.  Lung sounds reduced throughout.  Respiratory panel and chest x-ray ordered  EKG obtained by nursing staff given complaint of chest pain and shortness of breath.  EKG showed nose normal sinus rhythm, right axis deviation, pulmonary disease pattern and right ventricular hypertrophy.  This compared to EKG from 2021 shows no significant changes.  Duoneb  ordered.  Patient reports feeling significantly better after receiving DuoNeb treatment, but unfortunately it does not really increase his oxygen saturation.  He is speaking easier though.  Offered second DuoNeb treatment but he declines.  Also discussed IM dexamethasone injection but he declines citing concerns with cost and not having health insurance at  this time.  He requests prednisone and an inhaler.  We discussed use of GoodRx.  BP re-check 155/95.   Negative respiratory panel testing.  Asthma exacerbation.  Sent prednisone taper to pharmacy as well as albuterol inhaler.  Encouraged increasing rest and fluids.  Thoroughly reviewed calling 911 or going to ER if worsening headaches, dizziness, worsening chest pain, increased shortness of breath or no improvement in symptoms in 24 to 48 hours.  Patient is understanding and agreeable.  Work note was given.   Final Clinical Impressions(s) / UC Diagnoses   Final diagnoses:  Chest pain, unspecified type  Exacerbation of asthma, unspecified asthma severity, unspecified whether persistent  Shortness of breath  Bronchitis     Discharge Instructions      -Chest x-ray does not show pneumonia.  You do have bronchitis and asthma flareup. - Negative COVID and flu testing. - You were given a breathing treatment in the clinic which helped.  I refilled your albuterol inhaler and also sent prednisone. - Do realize that the prednisone can elevate your blood sugars so monitor closely. - If you feel that your breathing worsens or is not improving in the next 24 to 48 hours, consider reexamination. - If associated pain in your chest when he breathes, dizziness, weakness, increased shortness of breath, leg swelling, racing heart, etc.: I want her husband take you to the ER for further evaluation.     ED Prescriptions     Medication Sig Dispense Auth. Provider   predniSONE (DELTASONE) 10 MG tablet Take 4 tabs po x 2 days 3 tabs x 2 days, 2 tabs x 2 days, 1 tab x 2 days 20 tablet Eusebio Friendly B, PA-C   albuterol (VENTOLIN HFA) 108 (90 Base) MCG/ACT inhaler Inhale 1-2 puffs into the lungs every 6 (six) hours as needed for wheezing or shortness of breath. 1 each Gareth Morgan      PDMP not reviewed this encounter.   Shirlee Latch, PA-C 04/23/23 1038

## 2023-04-23 NOTE — ED Triage Notes (Signed)
 Patient c/o cough and SOB that started last night.  Patient reports chest pain, back pain and headache that started last night.

## 2023-04-23 NOTE — Discharge Instructions (Addendum)
-  Chest x-ray does not show pneumonia.  You do have bronchitis and asthma flareup. - Negative COVID and flu testing. - You were given a breathing treatment in the clinic which helped.  I refilled your albuterol inhaler and also sent prednisone. - Do realize that the prednisone can elevate your blood sugars so monitor closely. - If you feel that your breathing worsens or is not improving in the next 24 to 48 hours, consider reexamination. - If associated pain in your chest when he breathes, dizziness, weakness, increased shortness of breath, leg swelling, racing heart, etc.: I want her husband take you to the ER for further evaluation.

## 2023-05-11 ENCOUNTER — Ambulatory Visit: Payer: Self-pay | Admitting: Nurse Practitioner

## 2023-05-12 ENCOUNTER — Other Ambulatory Visit: Payer: BC Managed Care – PPO

## 2023-05-17 ENCOUNTER — Ambulatory Visit: Payer: Self-pay

## 2023-05-17 ENCOUNTER — Encounter: Payer: Self-pay | Admitting: Emergency Medicine

## 2023-05-17 ENCOUNTER — Ambulatory Visit
Admission: EM | Admit: 2023-05-17 | Discharge: 2023-05-17 | Disposition: A | Discharge status: Home / Self Care | Payer: Self-pay

## 2023-05-17 DIAGNOSIS — J45901 Unspecified asthma with (acute) exacerbation: Secondary | ICD-10-CM

## 2023-05-17 DIAGNOSIS — R079 Chest pain, unspecified: Secondary | ICD-10-CM

## 2023-05-17 MED ORDER — FLUTICASONE-SALMETEROL 115-21 MCG/ACT IN AERO: 2.0000 | INHALATION_SPRAY | Freq: Two times a day (BID) | RESPIRATORY_TRACT | 5 refills | Status: AC

## 2023-05-17 MED ORDER — MONTELUKAST SODIUM 10 MG PO TABS: 10.0000 mg | ORAL_TABLET | Freq: Every day | ORAL | 5 refills | Status: AC

## 2023-05-17 MED ORDER — ALBUTEROL SULFATE HFA 108 (90 BASE) MCG/ACT IN AERS: 1.0000 | INHALATION_SPRAY | Freq: Four times a day (QID) | RESPIRATORY_TRACT | 5 refills | Status: AC | PRN

## 2023-05-17 NOTE — ED Provider Notes (Signed)
 [facility]    CSN: [id number] Arrival date & time: 05/17/23  0955      History   Chief Complaint Chief Complaint  Patient presents with   Shortness of Breath   Chest Pain    HPI Cameron Thompson is a 43 y.o. male with history of asthma, allergies, gout, obesity, obstructive sleep apnea, and type 2 diabetes.  Presents today for dry cough, congestion, shortness of breath and chest tightness which has been worsening over the past week.  Denies fever, sore throat, or nasal congestion.  Denies any sick contacts or known exposure to COVID or flu. Patient seen here for similar symptoms a couple of weeks ago. Has been using albuterol  and taking allergy meds. Did not take prescribed prednisone .  Patient says he has a CPAP at home but does not have everything for it and sleep medicine has not contacted him with further instructions. No history of PE or DVT.  HPI  Past Medical History:  Diagnosis Date   Allergy    Asthma    pollen season   Gout    History of kidney stones    Obese    Vitamin D  deficiency     Patient Active Problem List   Diagnosis Date Noted   Controlled type 2 diabetes mellitus without complication, without long-term current use of insulin (HCC) 02/17/2023   Morbid obesity (HCC) 02/10/2023   Prediabetes 02/10/2023   Excessive daytime sleepiness 10/08/2022   Loud snoring 10/08/2022   Witnessed apneic spells 10/08/2022   Hypogonadism in male 09/23/2022   Non-restorative sleep 08/05/2022   Erectile dysfunction 08/05/2022   History of gout 08/05/2022   Fatigue 08/05/2022   Change in vision- with headaches.  03/19/2020   Dizzinesses with headaches.  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 Authorizing Provider  albuterol  (VENTOLIN  HFA) 108 (90 Base) MCG/ACT inhaler Inhale 1-2 puffs into the lungs every 6 (six) hours as needed for wheezing or shortness of breath. 04/23/23  Yes Floydene Hy, PA-C  albuterol  (VENTOLIN  HFA) 108 (90 Base) MCG/ACT inhaler Inhale 1-2 puffs into the lungs every 6 (six) hours as needed for wheezing or shortness of breath. 05/17/23  Yes Nancy Axon B, PA-C  Cetirizine HCl (ZYRTEC ALLERGY PO) Take by mouth.   Yes [provider]  fluticasone-salmeterol (ADVAIR HFA) 115-21 MCG/ACT inhaler Inhale 2 puffs into the lungs 2 (two) times daily. 05/17/23  Yes Mayford Speaks,  Fara Hone, PA-C  montelukast (SINGULAIR) 10 MG tablet Take 1 tablet (10 mg total) by mouth at bedtime. 05/17/23  Yes Nancy Axon B, PA-C  predniSONE  (DELTASONE ) 10 MG tablet Take 4 tabs po x 2 days 3 tabs x 2 days, 2 tabs x 2 days, 1 tab x 2 days 04/23/23   Nancy Axon B, PA-C  Semaglutide ,0.25 or 0.5MG /DOS, (OZEMPIC , 0.25 OR 0.5 MG/DOSE,) 2 MG/3ML SOPN Inject 0.25 mg into the skin once a week. Patient not taking: Reported on 02/18/2023 02/11/23   Dorothe Gaster, NP  testosterone  cypionate (DEPOTESTOSTERONE CYPIONATE) 200 MG/ML injection Inject 1 mL (200 mg total) into the muscle  every 14 (fourteen) days. 12/30/22   Dorothe Gaster, NP  mupirocin  nasal ointment (BACTROBAN  NASAL) 2 % Apply small amount to right nostril twice daily as directed. Patient not taking: Reported on 03/03/2019 01/27/19 09/11/19  Flinchum, Charlton Cooler, FNP    Family History Family History  Problem Relation Age of Onset   Pulmonary embolism Mother    Cancer Maternal Grandmother        lung   Diabetes Paternal Grandmother     Social History Social History   Tobacco Use   Smoking status: Never   Smokeless tobacco: Never  Vaping Use   Vaping status: Never Used  Substance Use Topics   Alcohol use: Yes    Comment: occasionally   Drug use: No     Allergies   Kiwi extract   Review of Systems Review of Systems  Constitutional:  Positive for fatigue. Negative for fever.  HENT:  Positive for congestion and rhinorrhea. Negative for sinus pressure, sinus pain and sore throat.   Respiratory:  Positive for cough and shortness of breath.   Cardiovascular:  Positive for chest pain. Negative for palpitations.  Gastrointestinal:  Negative for abdominal pain, diarrhea, nausea and vomiting.  Musculoskeletal:  Negative for myalgias.  Neurological:  Positive for headaches. Negative for weakness and light-headedness.  Hematological:  Negative for adenopathy.     Physical Exam Triage Vital Signs ED Triage Vitals  Encounter Vitals Group     BP 04/23/23 0916 (!) 138/90     Systolic BP Percentile --      Diastolic BP Percentile --      Pulse Rate 04/23/23 0916 (!) 105     Resp 04/23/23 0916 20     Temp 04/23/23 0916 (!) 97.5 F (36.4 C)     Temp Source 04/23/23 0916 Oral     SpO2 04/23/23 0916 94 %     Weight 04/23/23 0914 (!) 410 lb 7.9 oz (186.2 kg)     Height 04/23/23 0914 5\' 8"  (1.727 m)     Head Circumference --      Peak Flow --      Pain Score 04/23/23 0914 3     Pain Loc --      Pain Education --      Exclude from Growth Chart --    No data found.  Updated Vital Signs BP  (!) 145/93 (BP Location: Left Wrist)   Pulse 91   Temp 97.9 F (36.6 C) (Oral)   Resp 18   SpO2 95%      Physical Exam Vitals and nursing note reviewed.  Constitutional:      General: He is in acute distress.     Appearance: Normal appearance. He is well-developed. He is obese. He is not ill-appearing.  HENT:     Head: Normocephalic and atraumatic.  Nose: Congestion present.     Mouth/Throat:     Mouth: Mucous membranes are moist.     Pharynx: Oropharynx is clear.  Eyes:     General: No scleral icterus.    Conjunctiva/sclera:     Right eye: Right conjunctiva is injected.     Left eye: Left conjunctiva is injected.  Cardiovascular:     Rate and Rhythm: Normal rate and regular rhythm.     Heart sounds: Normal heart sounds.  Pulmonary:     Effort: No respiratory distress.     Breath sounds: Wheezing (few scattered wheezes) present. No rhonchi.  Musculoskeletal:     Cervical back: Neck supple.  Skin:    General: Skin is warm and dry.     Capillary Refill: Capillary refill takes less than 2 seconds.  Neurological:     General: No focal deficit present.     Mental Status: He is alert. Mental status is at baseline.     Motor: No weakness.     Gait: Gait normal.  Psychiatric:        Mood and Affect: Mood normal.        Behavior: Behavior normal.      UC Treatments / Results  Labs (all labs ordered are listed, but only abnormal results are displayed) Labs Reviewed - No data to display   EKG   Radiology DG Chest 2 View Result Date: 05/17/2023 CLINICAL DATA:  Chest pain EXAM: CHEST - 2 VIEW COMPARISON:  Chest x-ray performed April levin 2025 FINDINGS: The heart size and mediastinal contours are within normal limits. Both lungs are clear. The visualized skeletal structures are unremarkable. IMPRESSION: No active cardiopulmonary disease. Electronically Signed   By: Reagan Camera M.D.   On: 05/17/2023 10:57    Procedures Procedures (including critical care  time)  Medications Ordered in UC Medications - No data to display   Initial Impression / Assessment and Plan / UC Course  I have reviewed the triage vital signs and the nursing notes.  Pertinent labs & imaging results that were available during my care of the patient were reviewed by me and considered in my medical decision making (see chart for details).   43 year old obese male with history of asthma, allergies, obstructive sleep apnea, hypertension and diabetes presents for fatigue, cough, shortness of breath and chest pain for the past couple of weeks. No history of PE or DVT.  Denies pleuritic pain.  BP 145/93.  Currently afebrile. Overall well-appearing.  No distress.  Lung sounds reduced throughout. Few scattered wheezes.   Chest x-ray ordered and negative.   Reviewed EKG from 2-3 weeks ago.   EKG showed normal sinus rhythm, right axis deviation, pulmonary disease pattern and right ventricular hypertrophy.  This compared to EKG from 2021 shows no significant changes.  Patient declined neb.  Asthma exacerbation.  Sent Singulair to pharmacy as well as albuterol  inhaler.  Advised to take prednisone  taper that was prescribed a couple weeks ago.  Additionally printed prescription for Advair for him to begin if that does not seem to be helping.  Patient has a PCP but says he cannot see them until his insurance is in effect next month.  Vies make an appointment as soon as possible with PCP.  Encouraged increasing rest and fluids.  Thoroughly reviewed calling 911 or going to ER if worsening headaches, dizziness, worsening chest pain, increased shortness of breath or no improvement in symptoms in 24 to 48 hours.  Patient is understanding and agreeable.  Work note was given.   Final Clinical Impressions(s) / UC Diagnoses   Final diagnoses:  Chest pain, unspecified type  Exacerbation of persistent asthma, unspecified asthma severity     Discharge Instructions      -Chest x-ray does not  show pneumonia.  You do have asthma flareup. - Negative COVID and flu testing. - I refilled your albuterol  inhaler and also sent Singulair. -Begin prednisone  taper you have at home. - Do realize that the prednisone  can elevate your blood sugars so monitor closely. - If you feel that your breathing worsens or is not improving in the next 24 to 48 hours, consider reexamination. -If still having shortness of breath and flare up after prednisone  consider filling prescription for Advair. - If associated pain in your chest when you breathe, dizziness, weakness, increased shortness of breath, leg swelling, racing heart, go to the ER for further evaluation.     ED Prescriptions     Medication Sig Dispense Auth. Provider   montelukast (SINGULAIR) 10 MG tablet Take 1 tablet (10 mg total) by mouth at bedtime. 30 tablet Nancy Axon B, PA-C   albuterol  (VENTOLIN  HFA) 108 (90 Base) MCG/ACT inhaler Inhale 1-2 puffs into the lungs every 6 (six) hours as needed for wheezing or shortness of breath. 1 each Floydene Hy, PA-C   fluticasone-salmeterol (ADVAIR HFA) 115-21 MCG/ACT inhaler Inhale 2 puffs into the lungs 2 (two) times daily. 1 each Shaunna [place]      PDMP not reviewed this encounter.   Floydene Hy, PA-C 04/23/23 1038    Nancy Axon B, [place] 05/17/23 1232

## 2023-05-17 NOTE — Discharge Instructions (Addendum)
-  Chest x-ray does not show pneumonia.  You do have asthma flareup. - Negative COVID and flu testing. - I refilled your albuterol  inhaler and also sent Singulair. -Begin prednisone  taper you have at home. - Do realize that the prednisone  can elevate your blood sugars so monitor closely. - If you feel that your breathing worsens or is not improving in the next 24 to 48 hours, consider reexamination. -If still having shortness of breath and flare up after prednisone  consider filling prescription for Advair. - If associated pain in your chest when you breathe, dizziness, weakness, increased shortness of breath, leg swelling, racing heart, go to the ER for further evaluation.

## 2023-05-17 NOTE — ED Triage Notes (Signed)
 Pt presents with chest pain x 1 week and SOB for several weeks. He has been using his inhaler with no relief.

## 2023-07-01 ENCOUNTER — Ambulatory Visit (INDEPENDENT_AMBULATORY_CARE_PROVIDER_SITE_OTHER): Payer: Self-pay | Admitting: Nurse Practitioner

## 2023-07-01 VITALS — BP 126/78 | HR 91 | Temp 98.1°F | Ht 68.0 in | Wt >= 6400 oz

## 2023-07-01 DIAGNOSIS — R350 Frequency of micturition: Secondary | ICD-10-CM | POA: Diagnosis not present

## 2023-07-01 DIAGNOSIS — E559 Vitamin D deficiency, unspecified: Secondary | ICD-10-CM | POA: Diagnosis not present

## 2023-07-01 DIAGNOSIS — R351 Nocturia: Secondary | ICD-10-CM | POA: Insufficient documentation

## 2023-07-01 DIAGNOSIS — E119 Type 2 diabetes mellitus without complications: Secondary | ICD-10-CM

## 2023-07-01 DIAGNOSIS — R600 Localized edema: Secondary | ICD-10-CM | POA: Insufficient documentation

## 2023-07-01 DIAGNOSIS — R202 Paresthesia of skin: Secondary | ICD-10-CM | POA: Insufficient documentation

## 2023-07-01 NOTE — Assessment & Plan Note (Signed)
 History of the same pending vitamin D level

## 2023-07-01 NOTE — Assessment & Plan Note (Signed)
 History of the same.  Patient is interested in gastric procedure such as gastric balloon versus surgeries.  Will refer patient to [facility] surgery for bariatric operation consultation.

## 2023-07-01 NOTE — Assessment & Plan Note (Signed)
 Urinalysis with possible culture if indicated.  If negative and no glucose with normal A1c consider tamsulosin 

## 2023-07-01 NOTE — Assessment & Plan Note (Signed)
 History of the same.  Patient was lost to follow-up.  He is having some paresthesias of the lower extremities and increased urination.  Will recheck A1c to make sure it is not increased.  Patient has had some weight increase since last office visit.  Detailed foot exam done today and UACR

## 2023-07-01 NOTE — Patient Instructions (Signed)
 Nice to see you today Elevate your legs when you can Wear compression socks during the day Follow up with me in 3 months, sooner if you need me   I will be in touch with the labs once I have them

## 2023-07-01 NOTE — Assessment & Plan Note (Signed)
 Likely dependent in nature.  Patient will try to elevate legs as much as possible throughout the day.  Wear compression garments do not wear compression on it at night will check basic labs inclusive of CMP, TSH, BNP.  If all negative can consider a trial of diuretic as patient does have history of hypertension

## 2023-07-01 NOTE — Progress Notes (Signed)
 Acute Office Visit  Subjective:     Patient ID: Cameron Thompson, male    DOB: [birth year], 43 y.o.   MRN: [id number]  Chief Complaint  Patient presents with   swelling    Pt complains of swelling in legs, feet, and hands. Pt states that his skin hurts from swelling    Weight Loss Surgery    HPI Patient is in today for multiple complaints with a history of hypertension, hypothyroidism, hypogonadism, controlled DM type II, morbid obesity  Swelling: states that he thought it was just adipose tissue. States that a couple of months ago people noticed that he skin was shiny, red, and warm to the touch. States that the has had to big larger shoes. Statse that the swelling does not go down with elevation. He has tried compression garments in the past that does help but it comes back. He also mentions that it will be a burning sensation or a hyper sensation of pain  Weight loss surgery: he is interested in weight loss intervention. He has done weight loss shakes and supplements. He has tried vegeatrain, He has also tried going to the gym. He has not tried fasting.  States that with the weight loss shakes he has lost 30 pounds  DM2: patient was found to be diabetic at his CPE in Jan 2025. We sent in ozempic . Does not look like he has started the medication. States that he is urinating a lot. States that he is also having urgency. No dysuria. We did try ozempic  but it was financially unobtainable even with a coupon   Review of Systems  Constitutional:  Negative for chills and fever.  Respiratory:  Negative for shortness of breath.   Cardiovascular:  Positive for leg swelling. Negative for chest pain.  Gastrointestinal:        BM daily to every other day   Genitourinary:  Positive for frequency and urgency. Negative for dysuria and hematuria.       Nocturia +  Neurological:  Positive for tingling (lower extremites). Negative for headaches.  Psychiatric/Behavioral:  Negative for  hallucinations and suicidal ideas.         Objective:    BP 126/78   Pulse 91   Temp 98.1 F (36.7 C) (Oral)   Ht 5' 8 (1.727 m)   Wt (!) 426 lb 6.4 oz (193.4 kg)   SpO2 93%   BMI 64.83 kg/m  BP Readings from Last 3 Encounters:  07/01/23 126/78  05/17/23 (!) 145/93  04/23/23 (!) 155/95   Wt Readings from Last 3 Encounters:  07/01/23 (!) 426 lb 6.4 oz (193.4 kg)  04/23/23 (!) 410 lb 7.9 oz (186.2 kg)  02/18/23 (!) 408 lb 4 oz (185.2 kg)   SpO2 Readings from Last 3 Encounters:  07/01/23 93%  05/17/23 95%  04/23/23 93%      Physical Exam Vitals and nursing note reviewed.  Constitutional:      Appearance: Normal appearance.   Cardiovascular:     Rate and Rhythm: Normal rate and regular rhythm.     Pulses:          Dorsalis pedis pulses are 2+ on the right side and 2+ on the left side.     Heart sounds: Normal heart sounds.  Pulmonary:     Effort: Pulmonary effort is normal.     Breath sounds: Normal breath sounds.   Musculoskeletal:     Right lower leg: Edema present.     Left lower leg: Edema  present.   Neurological:     Mental Status: He is alert.    Title   Diabetic Foot Exam - detailed Is there a history of foot ulcer?: No Is there a foot ulcer now?: No Is there swelling?: No Is there elevated skin temperature?: No Is there abnormal foot shape?: No Is there a claw toe deformity?: No Are the toenails long?: No Are the toenails thick?: No Are the toenails ingrown?: No Pulse Foot Exam completed.: Yes   Right Dorsalis Pedis: Present Left Dorsalis Pedis: Present     Sensory Foot Exam Completed.: Yes Semmes-Weinstein Monofilament Test + means has sensation and - means no sensation      Image components are not supported.   Image components are not supported. Image components are not supported.  Tuning [place] Comments All 10 sites tested sensation intact but diminished      No results found for any visits on 07/01/23.       Assessment & Plan:   Problem List Items Addressed This Visit       Endocrine   Controlled type 2 diabetes mellitus without complication, without long-term current use of insulin (HCC)   History of the same.  Patient was lost to follow-up.  He is having some paresthesias of the lower extremities and increased urination.  Will recheck A1c to make sure it is not increased.  Patient has had some weight increase since last office visit.  Detailed foot exam done today and UACR      Relevant Orders   CBC   Comprehensive metabolic panel with GFR   Hemoglobin A1c     Other   Vitamin D  deficiency   History of the same pending vitamin D  level      Relevant Orders   VITAMIN D  25 Hydroxy (Vit-D Deficiency, Fractures)   Morbid obesity (HCC)   History of the same.  Patient is interested in gastric procedure such as gastric balloon versus surgeries.  Will refer patient to [facility] surgery for bariatric operation consultation.      Relevant Orders   Ambulatory referral to General Surgery   Paresthesia - Primary   Ambiguous in nature.  Patient new onset diabetes query early beginnings of neuropathy will check B12 level along with CBC, CMP and TSH.      Relevant Orders   Vitamin B12   TSH   Lower extremity edema   Likely dependent in nature.  Patient will try to elevate legs as much as possible throughout the day.  Wear compression garments do not wear compression on it at night will check basic labs inclusive of CMP, TSH, BNP.  If all negative can consider a trial of diuretic as patient does have history of hypertension      Relevant Orders   CBC   Comprehensive metabolic panel with GFR   Brain natriuretic peptide   Urinary frequency   Urinalysis with possible culture if indicated.  If negative and no glucose with normal A1c consider tamsulosin       Relevant Orders   Urinalysis w microscopic + reflex cultur   Microalbumin / creatinine urine ratio   Nocturia    No orders  of the defined types were placed in this encounter.   Return in about 3 months (around 10/01/2023) for DM recheck.  Margarie Shay, NP

## 2023-07-01 NOTE — Assessment & Plan Note (Signed)
 Ambiguous in nature.  Patient new onset diabetes query early beginnings of neuropathy will check B12 level along with CBC, CMP and TSH.

## 2023-07-02 ENCOUNTER — Encounter: Payer: Self-pay | Admitting: *Deleted

## 2023-07-02 LAB — COMPREHENSIVE METABOLIC PANEL WITH GFR
ALT: 78 U/L — ABNORMAL HIGH (ref 0–53)
AST: 62 U/L — ABNORMAL HIGH (ref 0–37)
Albumin: 4.1 g/dL (ref 3.5–5.2)
Alkaline Phosphatase: 69 U/L (ref 39–117)
BUN: 8 mg/dL (ref 6–23)
CO2: 27 meq/L (ref 19–32)
Calcium: 9.3 mg/dL (ref 8.4–10.5)
Chloride: 100 meq/L (ref 96–112)
Creatinine, Ser: 0.75 mg/dL (ref 0.40–1.50)
GFR: 109.69 mL/min (ref >60.00)
Glucose, Bld: 82 mg/dL (ref 70–99)
Potassium: 4.3 meq/L (ref 3.5–5.1)
Sodium: 138 meq/L (ref 135–145)
Total Bilirubin: 0.4 mg/dL (ref 0.2–1.2)
Total Protein: 6.7 g/dL (ref 6.0–8.3)

## 2023-07-02 LAB — CBC
HCT: 44.7 % (ref 39.0–52.0)
Hemoglobin: 14.7 g/dL (ref 13.0–17.0)
MCHC: 32.8 g/dL (ref 30.0–36.0)
MCV: 87.9 fl (ref 78.0–100.0)
Platelets: 340 10*3/uL (ref 150.0–400.0)
RBC: 5.08 Mil/uL (ref 4.22–5.81)
RDW: 14.4 % (ref 11.5–15.5)
WBC: 8.6 10*3/uL (ref 4.0–10.5)

## 2023-07-02 LAB — MICROALBUMIN / CREATININE URINE RATIO
Creatinine,U: 88.7 mg/dL
Microalb Creat Ratio: 33.8 mg/g — ABNORMAL HIGH (ref 0.0–30.0)
Microalb, Ur: 3 mg/dL — ABNORMAL HIGH (ref 0.0–1.9)

## 2023-07-02 LAB — HEMOGLOBIN A1C: Hgb A1c MFr Bld: 6.5 % (ref 4.6–6.5)

## 2023-07-02 LAB — TSH: TSH: 2.84 u[IU]/mL (ref 0.35–5.50)

## 2023-07-02 LAB — BRAIN NATRIURETIC PEPTIDE: Pro B Natriuretic peptide (BNP): 14 pg/mL (ref 0.0–100.0)

## 2023-07-02 LAB — VITAMIN D 25 HYDROXY (VIT D DEFICIENCY, FRACTURES): VITD: 10.2 ng/mL — ABNORMAL LOW (ref 30.00–100.00)

## 2023-07-02 LAB — VITAMIN B12: Vitamin B-12: 383 pg/mL (ref 211–911)

## 2023-07-03 LAB — URINALYSIS W MICROSCOPIC + REFLEX CULTURE
Bacteria, UA: NONE SEEN /HPF
Bilirubin Urine: NEGATIVE
Glucose, UA: NEGATIVE
Hgb urine dipstick: NEGATIVE
Hyaline Cast: NONE SEEN /LPF
Ketones, ur: NEGATIVE
Nitrites, Initial: NEGATIVE
Protein, ur: NEGATIVE
RBC / HPF: NONE SEEN /HPF (ref 0–2)
Specific Gravity, Urine: 1.01 (ref 1.001–1.035)
WBC, UA: NONE SEEN /HPF (ref 0–5)
pH: 7.5 (ref 5.0–8.0)

## 2023-07-03 LAB — URINE CULTURE
MICRO NUMBER:: 16605418
Result:: NO GROWTH
SPECIMEN QUALITY:: ADEQUATE

## 2023-07-03 LAB — CULTURE INDICATED

## 2023-07-05 ENCOUNTER — Ambulatory Visit: Payer: Self-pay | Admitting: Nurse Practitioner

## 2023-07-05 DIAGNOSIS — E559 Vitamin D deficiency, unspecified: Secondary | ICD-10-CM

## 2023-07-05 DIAGNOSIS — E1129 Type 2 diabetes mellitus with other diabetic kidney complication: Secondary | ICD-10-CM

## 2023-07-05 DIAGNOSIS — R600 Localized edema: Secondary | ICD-10-CM

## 2023-07-05 MED ORDER — LISINOPRIL 5 MG PO TABS
5.0000 mg | ORAL_TABLET | Freq: Every day | ORAL | 1 refills | Status: DC
Start: 2023-07-05 — End: 2023-10-01

## 2023-07-05 MED ORDER — FUROSEMIDE 20 MG PO TABS: 20.0000 mg | ORAL_TABLET | Freq: Every day | ORAL | 0 refills | Status: DC

## 2023-07-05 MED ORDER — VITAMIN D (ERGOCALCIFEROL) 1.25 MG (50000 UNIT) PO CAPS: 50000.0000 [IU] | ORAL_CAPSULE | ORAL | 0 refills | Status: DC

## 2023-10-01 ENCOUNTER — Ambulatory Visit
Admission: RE | Admit: 2023-10-01 | Discharge: 2023-10-01 | Disposition: A | Discharge status: Home / Self Care | Attending: Nurse Practitioner | Admitting: Nurse Practitioner

## 2023-10-01 ENCOUNTER — Ambulatory Visit: Admitting: Nurse Practitioner

## 2023-10-01 ENCOUNTER — Other Ambulatory Visit: Payer: Self-pay | Admitting: Medical Genetics

## 2023-10-01 ENCOUNTER — Ambulatory Visit
Admission: RE | Admit: 2023-10-01 | Discharge: 2023-10-01 | Disposition: A | Discharge status: Home / Self Care | Source: Ambulatory Visit | Attending: Nurse Practitioner | Admitting: Nurse Practitioner

## 2023-10-01 VITALS — BP 132/86 | HR 91 | Temp 97.8°F | Ht 68.0 in | Wt >= 6400 oz

## 2023-10-01 DIAGNOSIS — E1129 Type 2 diabetes mellitus with other diabetic kidney complication: Secondary | ICD-10-CM

## 2023-10-01 DIAGNOSIS — M545 Low back pain, unspecified: Secondary | ICD-10-CM | POA: Insufficient documentation

## 2023-10-01 DIAGNOSIS — R600 Localized edema: Secondary | ICD-10-CM | POA: Diagnosis not present

## 2023-10-01 DIAGNOSIS — E559 Vitamin D deficiency, unspecified: Secondary | ICD-10-CM | POA: Diagnosis not present

## 2023-10-01 DIAGNOSIS — E119 Type 2 diabetes mellitus without complications: Secondary | ICD-10-CM

## 2023-10-01 DIAGNOSIS — R809 Proteinuria, unspecified: Secondary | ICD-10-CM

## 2023-10-01 LAB — POCT GLYCOSYLATED HEMOGLOBIN (HGB A1C): Hemoglobin A1C: 6 % — AB (ref 4.0–5.6)

## 2023-10-01 MED ORDER — PREDNISONE 10 MG (21) PO TBPK: ORAL_TABLET | ORAL | 0 refills | Status: AC

## 2023-10-01 MED ORDER — LISINOPRIL 5 MG PO TABS: 5.0000 mg | ORAL_TABLET | Freq: Every day | ORAL | 1 refills | Status: AC

## 2023-10-01 MED ORDER — VITAMIN D (ERGOCALCIFEROL) 1.25 MG (50000 UNIT) PO CAPS: 50000.0000 [IU] | ORAL_CAPSULE | ORAL | 0 refills | Status: AC

## 2023-10-01 NOTE — Patient Instructions (Signed)
 Nice to see you today  I will be in touch with the xray once I have it Follow up with me in 6 months You need a lab visit in 1 month  Avoid NSAIDS like Ibuprofen , Motrin , Aleve , Naproxen , BC/Goody powders while taking the prednisone 

## 2023-10-01 NOTE — Progress Notes (Signed)
 Established Patient Office Visit  Subjective   Patient ID: Cameron Thompson, male    DOB: 1980-09-16  Age: 43 y.o. MRN: [id number]  Chief Complaint  Patient presents with   Diabetes   Medication Refill    Lisinopril  and vitamin D     Discussed the use of AI scribe software for clinical note transcription with the patient, who gave verbal consent to proceed.  History of Present Illness Cong Cameron Thompson is a 43 year old male with diabetes and hypertension who presents for follow-up of back pain and diabetes management.  He has been experiencing lower back pain for the past two to three weeks, which has been worsening daily. The pain is described as dull with occasional sharp tendencies and is located across the lower back, without radiation to the buttocks or legs. No numbness, tingling, or weakness in the legs. The pain is exacerbated by both sitting and movement, particularly noticeable after work. He has been assisting in transferring his immobile father, which may contribute to the pain. He has tried naproxen , Tylenol , and methocarbamol, with some relief noted from naproxen .  He has a history of diabetes, with an A1c that has improved from 6.7 seven months ago to 6.0 currently. He attributes this improvement to dietary changes, including reduced portion sizes at dinner and varying meal frequency throughout the week. He typically consumes one to two meals per day, with occasional snacking on desserts at night. He has increased his water intake but continues to consume soda and one energy drink daily. He has also lost ten pounds, which he attributes to increased physical activity at work.  He was previously prescribed lisinopril  but has not taken it for at least a month due to a lapse in refilling the prescription. He was also given vitamin D  supplements for a low level of 10.2, but he has not continued them beyond the initial three-month supply.  He was prescribed furosemide  for leg  swelling, which he took for a week several months ago. Others noticed a reduction in leg swelling, and he feels his legs are smaller, though he is uncertain if this is due to the medication or other factors.  He reports a history of elevated blood pressure readings, but recent measurements have been within normal limits.     Review of Systems  Constitutional:  Negative for chills and fever.  Respiratory:  Negative for shortness of breath.   Cardiovascular:  Negative for chest pain.  Musculoskeletal:  Positive for back pain.  Neurological:  Negative for tingling, weakness and headaches.      Objective:     BP 132/86   Pulse 91   Temp 97.8 F (36.6 C) (Oral)   Ht 5' 8 (1.727 m)   Wt (!) 416 lb 12.8 oz (189.1 kg)   SpO2 95%   BMI 63.37 kg/m  BP Readings from Last 3 Encounters:  10/01/23 132/86  07/01/23 126/78  05/17/23 (!) 145/93   Wt Readings from Last 3 Encounters:  10/01/23 (!) 416 lb 12.8 oz (189.1 kg)  07/01/23 (!) 426 lb 6.4 oz (193.4 kg)  04/23/23 (!) 410 lb 7.9 oz (186.2 kg)   SpO2 Readings from Last 3 Encounters:  10/01/23 95%  07/01/23 93%  05/17/23 95%      Physical Exam Vitals and nursing note reviewed.  Constitutional:      Appearance: Normal appearance.  Cardiovascular:     Rate and Rhythm: Normal rate and regular rhythm.     Heart sounds: Normal heart sounds.  Pulmonary:     Effort: Pulmonary effort is normal.     Breath sounds: Normal breath sounds.  Musculoskeletal:        General: Tenderness present.     Lumbar back: Tenderness and bony tenderness present. Negative right straight leg raise test and negative left straight leg raise test.       Back:     Comments: Lumbar flexion Lateral left side and rotation makes it worse   Neurological:     Mental Status: He is alert.      Results for orders placed or performed in visit on 10/01/23  POCT glycosylated hemoglobin (Hb A1C)  Result Value Ref Range   Hemoglobin A1C 6.0 (A) 4.0 - 5.6  %   HbA1c POC (<> result, manual entry)     HbA1c, POC (prediabetic range)     HbA1c, POC (controlled diabetic range)        The 10-year ASCVD risk score (Arnett DK, et al., 2019) is: 6.1%    Assessment & Plan:   Problem List Items Addressed This Visit       Endocrine   Controlled type 2 diabetes mellitus without complication, without long-term current use of insulin (HCC) - Primary   Relevant Medications   lisinopril  (ZESTRIL ) 5 MG tablet   Other Relevant Orders   POCT glycosylated hemoglobin (Hb A1C) (Completed)     Other   Vitamin D  deficiency   Relevant Medications   Vitamin D , Ergocalciferol , (DRISDOL ) 1.25 MG (50000 UNIT) CAPS capsule   Morbid obesity (HCC)   Lower extremity edema   Other Visit Diagnoses       Microalbuminuria due to type 2 diabetes mellitus (HCC)       Relevant Medications   lisinopril  (ZESTRIL ) 5 MG tablet     Acute bilateral low back pain without sciatica       Relevant Medications   predniSONE  (STERAPRED UNI-PAK 21 TAB) 10 MG (21) TBPK tablet   Other Relevant Orders   DG Lumbar Spine Complete     Assessment and Plan Assessment & Plan Low back pain Chronic musculoskeletal low back pain, worsening over weeks, likely muscle-related. - Prescribe prednisone  dose pack. - Advise prednisone  with food in the morning. - Instruct to avoid NSAIDs while on prednisone . - Recommend acetaminophen  for additional pain relief. - Advise on proper body mechanics. - Allow use of ice, heat, or topical analgesics.  Microalbuminuria  previously controlled with lisinopril , currently off medication due to refill lapse. - Prescribe 60 days of lisinopril . - Schedule lab visit in one month for kidney function and potassium levels.  Type 2 diabetes mellitus Improved glycemic control with decreased A1c from 6.7% to 6.0% through diet and exercise. - Continue dietary and lifestyle modifications. - Schedule follow-up in six months unless issues arise.  Morbid  obesity Morbid obesity with recent weight loss due to increased activity and dietary changes. - Encourage continued physical activity and dietary modifications. - Schedule follow-up in six months unless issues arise.  Vitamin D  deficiency Severe vitamin D  deficiency with level at 10.2 ng/mL, requires ongoing supplementation. - Prescribe six months of vitamin D  supplementation.   Return in about 6 months (around 03/30/2024) for DM recheck.    Adina Crandall, NP

## 2023-10-05 ENCOUNTER — Other Ambulatory Visit

## 2023-10-08 ENCOUNTER — Ambulatory Visit: Payer: Self-pay | Admitting: Nurse Practitioner

## 2023-10-12 ENCOUNTER — Other Ambulatory Visit

## 2023-10-19 ENCOUNTER — Other Ambulatory Visit
Admission: RE | Admit: 2023-10-19 | Discharge: 2023-10-19 | Disposition: A | Discharge status: Home / Self Care | Payer: Self-pay | Source: Ambulatory Visit | Attending: Medical Genetics | Admitting: Medical Genetics

## 2023-10-26 ENCOUNTER — Other Ambulatory Visit: Payer: Self-pay | Admitting: Nurse Practitioner

## 2023-10-26 DIAGNOSIS — I10 Essential (primary) hypertension: Secondary | ICD-10-CM

## 2023-10-26 DIAGNOSIS — E119 Type 2 diabetes mellitus without complications: Secondary | ICD-10-CM

## 2023-10-30 LAB — GENECONNECT MOLECULAR SCREEN: Genetic Analysis Overall Interpretation: NEGATIVE

## 2023-11-02 ENCOUNTER — Other Ambulatory Visit

## 2023-11-08 ENCOUNTER — Other Ambulatory Visit (INDEPENDENT_AMBULATORY_CARE_PROVIDER_SITE_OTHER)

## 2023-11-08 DIAGNOSIS — R809 Proteinuria, unspecified: Secondary | ICD-10-CM

## 2023-11-08 DIAGNOSIS — E1129 Type 2 diabetes mellitus with other diabetic kidney complication: Secondary | ICD-10-CM | POA: Diagnosis not present

## 2023-11-08 LAB — BASIC METABOLIC PANEL WITH GFR
BUN: 7 mg/dL (ref 6–23)
CO2: 30 meq/L (ref 19–32)
Calcium: 9 mg/dL (ref 8.4–10.5)
Chloride: 100 meq/L (ref 96–112)
Creatinine, Ser: 0.63 mg/dL (ref 0.40–1.50)
GFR: 115.34 mL/min (ref >60.00)
Glucose, Bld: 111 mg/dL — ABNORMAL HIGH (ref 70–99)
Potassium: 4.2 meq/L (ref 3.5–5.1)
Sodium: 138 meq/L (ref 135–145)

## 2023-11-10 ENCOUNTER — Emergency Department
Admission: EM | Admit: 2023-11-10 | Discharge: 2023-11-10 | Disposition: A | Discharge status: Home / Self Care | Attending: Emergency Medicine | Admitting: Emergency Medicine

## 2023-11-10 ENCOUNTER — Ambulatory Visit: Payer: Self-pay | Admitting: Nurse Practitioner

## 2023-11-10 ENCOUNTER — Emergency Department

## 2023-11-10 ENCOUNTER — Other Ambulatory Visit: Payer: Self-pay

## 2023-11-10 DIAGNOSIS — R0789 Other chest pain: Secondary | ICD-10-CM | POA: Diagnosis present

## 2023-11-10 DIAGNOSIS — J45909 Unspecified asthma, uncomplicated: Secondary | ICD-10-CM | POA: Diagnosis not present

## 2023-11-10 DIAGNOSIS — Z79899 Other long term (current) drug therapy: Secondary | ICD-10-CM | POA: Insufficient documentation

## 2023-11-10 DIAGNOSIS — M7989 Other specified soft tissue disorders: Secondary | ICD-10-CM | POA: Insufficient documentation

## 2023-11-10 DIAGNOSIS — E119 Type 2 diabetes mellitus without complications: Secondary | ICD-10-CM | POA: Insufficient documentation

## 2023-11-10 LAB — TROPONIN I (HIGH SENSITIVITY)
Troponin I (High Sensitivity): 5 ng/L (ref <18)
Troponin I (High Sensitivity): 6 ng/L (ref <18)

## 2023-11-10 LAB — BASIC METABOLIC PANEL WITH GFR
Anion gap: 13 (ref 5–15)
BUN: 11 mg/dL (ref 6–20)
CO2: 27 mmol/L (ref 22–32)
Calcium: 8.8 mg/dL — ABNORMAL LOW (ref 8.9–10.3)
Chloride: 98 mmol/L (ref 98–111)
Creatinine, Ser: 0.79 mg/dL (ref 0.61–1.24)
GFR, Estimated: >60 mL/min (ref >60)
Glucose, Bld: 109 mg/dL — ABNORMAL HIGH (ref 70–99)
Potassium: 3.8 mmol/L (ref 3.5–5.1)
Sodium: 138 mmol/L (ref 135–145)

## 2023-11-10 LAB — CBC
HCT: 43.1 % (ref 39.0–52.0)
Hemoglobin: 14.3 g/dL (ref 13.0–17.0)
MCH: 29.5 pg (ref 26.0–34.0)
MCHC: 33.2 g/dL (ref 30.0–36.0)
MCV: 89 fL (ref 80.0–100.0)
Platelets: 316 K/uL (ref 150–400)
RBC: 4.84 MIL/uL (ref 4.22–5.81)
RDW: 13.5 % (ref 11.5–15.5)
WBC: 12.2 K/uL — ABNORMAL HIGH (ref 4.0–10.5)
nRBC: 0 % (ref 0.0–0.2)

## 2023-11-10 LAB — D-DIMER, QUANTITATIVE: D-Dimer, Quant: <0.27 ug{FEU}/mL (ref 0.00–0.50)

## 2023-11-10 MED ORDER — KETOROLAC TROMETHAMINE 30 MG/ML IJ SOLN
30.0000 mg | Freq: Once | INTRAMUSCULAR | Status: AC
  Administered 2023-11-10: 30 mg via INTRAVENOUS
  Filled 2023-11-10: qty 1

## 2023-11-10 MED ORDER — KETOROLAC TROMETHAMINE 10 MG PO TABS: 10.0000 mg | ORAL_TABLET | Freq: Three times a day (TID) | ORAL | 0 refills | Status: AC | PRN

## 2023-11-10 MED ORDER — ONDANSETRON 4 MG PO TBDP: 4.0000 mg | ORAL_TABLET | Freq: Four times a day (QID) | ORAL | 0 refills | Status: AC | PRN

## 2023-11-10 MED ORDER — ONDANSETRON HCL 4 MG/2ML IJ SOLN
4.0000 mg | Freq: Once | INTRAMUSCULAR | Status: AC
  Administered 2023-11-10: 4 mg via INTRAVENOUS
  Filled 2023-11-10: qty 2

## 2023-11-10 NOTE — ED Provider Notes (Signed)
 [facility] Provider Note    Event Date/Time   First MD Initiated Contact with Patient 11/10/23 0105     (approximate)   History   Chest Pain   HPI  Cameron Thompson is a 43 y.o. male with history of morbid obesity, type 2 diabetes, asthma who presents to the emergency department complaints of left-sided chest pain that has been intermittent since 2 PM yesterday.  Pain is becoming more frequent, constant.  No shortness of breath.  Has had nausea.  No fevers, cough or congestion.  Has chronic lower extremity swelling that is unchanged.  No calf pain.  No prior history of PE or DVT.  No recent prolonged immobilization, hospitalization, surgery, trauma.  States pain is worse with movement.   History provided by patient, family.    Past Medical History:  Diagnosis Date   Allergy    Asthma    pollen season   Gout    History of kidney stones    Obese    Vitamin D  deficiency     Past Surgical History:  Procedure Laterality Date   NO PAST SURGERIES     NO PAST SURGERIES     VENTRAL HERNIA REPAIR N/A 03/03/2019   Procedure: HERNIA REPAIR UMBILICAL ADULT;  Surgeon: Dessa Reyes ORN, MD;  Location: [facility] ORS;  Service: General;  Laterality: N/A;    MEDICATIONS:  Prior to Admission medications   Medication Sig Start Date End Date Taking? Authorizing Provider  albuterol  (VENTOLIN  HFA) 108 (90 Base) MCG/ACT inhaler Inhale 1-2 puffs into the lungs every 6 (six) hours as needed for wheezing or shortness of breath. 04/23/23   Arvis Jolan NOVAK, PA-C  albuterol  (VENTOLIN  HFA) 108 (90 Base) MCG/ACT inhaler Inhale 1-2 puffs into the lungs every 6 (six) hours as needed for wheezing or shortness of breath. 05/17/23   Arvis Jolan B, PA-C  Cetirizine HCl (ZYRTEC ALLERGY PO) Take by mouth.    [provider]  fluticasone -salmeterol (ADVAIR HFA) 115-21 MCG/ACT inhaler Inhale 2 puffs into the lungs 2 (two) times daily. 05/17/23   Arvis Jolan B, PA-C  lisinopril   (ZESTRIL ) 5 MG tablet Take 1 tablet (5 mg total) by mouth daily. 10/01/23   Wendee Lynwood HERO, NP  montelukast  (SINGULAIR ) 10 MG tablet Take 1 tablet (10 mg total) by mouth at bedtime. 05/17/23   Arvis Jolan NOVAK, PA-C  predniSONE  (STERAPRED UNI-PAK 21 TAB) 10 MG (21) TBPK tablet Take as directed 10/01/23   Wendee Lynwood HERO, NP  Vitamin D , Ergocalciferol , (DRISDOL ) 1.25 MG (50000 UNIT) CAPS capsule Take 1 capsule (50,000 Units total) by mouth every 7 (seven) days. 10/01/23   Wendee Lynwood HERO, NP  mupirocin  nasal ointment (BACTROBAN  NASAL) 2 % Apply small amount to right nostril twice daily as directed. Patient not taking: Reported on 03/03/2019 01/27/19 09/11/19  Jaime Rosaline RAMAN, FNP    Physical Exam   Triage Vital Signs: ED Triage Vitals  Encounter Vitals Group     BP 11/10/23 0108 (!) 150/92     Girls Systolic BP Percentile --      Girls Diastolic BP Percentile --      Boys Systolic BP Percentile --      Boys Diastolic BP Percentile --      Pulse Rate 11/10/23 0108 95     Resp 11/10/23 0108 20     Temp 11/10/23 0108 97.9 F (36.6 C)     Temp Source 11/10/23 0108 Oral     SpO2 11/10/23 0108 94 %  Weight 11/10/23 0103 (!) 420 lb (190.5 kg)     Height 11/10/23 0103 5' 9 (1.753 m)     Head Circumference --      Peak Flow --      Pain Score 11/10/23 0103 8     Pain Loc --      Pain Education --      Exclude from Growth Chart --     Most recent vital signs: Vitals:   11/10/23 0108  BP: (!) 150/92  Pulse: 95  Resp: 20  Temp: 97.9 F (36.6 C)  SpO2: 94%    CONSTITUTIONAL: Alert, responds appropriately to questions.  Obese, appears intermittently uncomfortable, pleasant HEAD: Normocephalic, atraumatic EYES: Conjunctivae clear, pupils appear equal, sclera nonicteric ENT: normal nose; moist mucous membranes NECK: Supple, normal ROM CARD: RRR; S1 and S2 appreciated; tender over the left chest wall without crepitus or deformity.  No overlying skin changes. RESP: Normal chest  excursion without splinting or tachypnea; breath sounds clear and equal bilaterally; no wheezes, no rhonchi, no rales, no hypoxia or respiratory distress, speaking full sentences ABD/GI: Non-distended; soft, non-tender, no rebound, no guarding, no peritoneal signs BACK: The back appears normal EXT: Normal ROM in all joints; no deformity noted, chronic symmetrical edema in bilateral distal lower extremities, no calf tenderness SKIN: Normal color for age and race; warm; no rash on exposed skin NEURO: Moves all extremities equally, normal speech PSYCH: The patient's mood and manner are appropriate.   ED Results / Procedures / Treatments   LABS: (all labs ordered are listed, but only abnormal results are displayed) Labs Reviewed  BASIC METABOLIC PANEL WITH GFR - Abnormal; Notable for the following components:      Result Value   Glucose, Bld 109 (*)    Calcium 8.8 (*)    All other components within normal limits  CBC - Abnormal; Notable for the following components:   WBC 12.2 (*)    All other components within normal limits  D-DIMER, QUANTITATIVE  TROPONIN I (HIGH SENSITIVITY)  TROPONIN I (HIGH SENSITIVITY)     EKG:  EKG Interpretation Date/Time:  Wednesday November 10 2023 01:13:50 EDT Ventricular Rate:  96 PR Interval:  142 QRS Duration:  104 QT Interval:  360 QTC Calculation: 455 R Axis:   -83  Text Interpretation: Sinus rhythm Incomplete RBBB and LAFB Low voltage, precordial leads Abnormal R-wave progression, late transition Confirmed by Neomi Neptune [phone]) on 11/10/2023 1:25:05 AM         RADIOLOGY: My personal review and interpretation of imaging: Chest x-ray clear.  I have personally reviewed all radiology reports.   DG Chest Portable 1 View Result Date: 11/10/2023 EXAM: 1 VIEW XRAY OF THE CHEST 11/10/2023 01:22:12 AM COMPARISON: PA and lateral chest 06/06/2023. CLINICAL HISTORY: CP, SOB. C/o chest pain starting 2pm yesterday. With shortness of breath, nausea,  and fatigue. FINDINGS: LUNGS AND PLEURA: No focal pulmonary opacity. No pulmonary edema. No pleural effusion. No pneumothorax. HEART AND MEDIASTINUM: No acute abnormality of the cardiac and mediastinal silhouettes. BONES AND SOFT TISSUES: No acute osseous abnormality. There are overlying telemetry leads. IMPRESSION: 1. No acute cardiopulmonary process. Electronically signed by: Francis Quam MD 11/10/2023 01:33 AM EDT RP Workstation: [id number]     PROCEDURES:  Critical Care performed: No      .1-3 Lead EKG Interpretation  Performed by: Brian Kocourek, Neptune SAILOR, DO Authorized by: Demica Zook, Neptune SAILOR, DO     Interpretation: normal     ECG rate:  95   ECG rate  assessment: normal     Rhythm: sinus rhythm     Ectopy: none     Conduction: normal       IMPRESSION / MDM / ASSESSMENT AND PLAN / ED COURSE  I reviewed the triage vital signs and the nursing notes.    Patient here with complaints of chest pain.  The patient is on the cardiac monitor to evaluate for evidence of arrhythmia and/or significant heart rate changes.   DIFFERENTIAL DIAGNOSIS (includes but not limited to):   Chest wall pain, costochondritis, PE, ACS, less likely CHF, pneumonia, pneumothorax, dissection unlikely   Patient's presentation is most consistent with acute presentation with potential threat to life or bodily function.   PLAN: Will obtain cardiac labs, D-dimer as patient does have intermittent tachycardia here and I am unable to use PERC criteria to rule him out.  Pain is somewhat reproducible with palpation, movement and inspiration.  Will give Toradol , Zofran  for symptomatic relief.  EKG nonischemic.   MEDICATIONS GIVEN IN ED: Medications  ketorolac  (TORADOL ) 30 MG/ML injection 30 mg (30 mg Intravenous Given 11/10/23 0149)  ondansetron  (ZOFRAN ) injection 4 mg (4 mg Intravenous Given 11/10/23 0150)     ED COURSE: 2:35 AM  First troponin negative.  Second pending.  D-dimer negative.  Chest x-ray reviewed  and interpreted by myself and the radiologist as unremarkable.  EKG nonischemic.  Patient reports pain has resolved after receiving Toradol .   3:58 AM  Pt's second troponin negative.  Still asymptomatic.  No events seen on cardiac monitoring.  Will discharge with prescription of Toradol .  Has a PCP for follow-up.   At this time, I do not feel there is any life-threatening condition present. I reviewed all nursing notes, vitals, pertinent previous records.  All lab and urine results, EKGs, imaging ordered have been independently reviewed and interpreted by myself.  I reviewed all available radiology reports from any imaging ordered this visit.  Based on my assessment, I feel the patient is safe to be discharged home without further emergent workup and can continue workup as an outpatient as needed. Discussed all findings, treatment plan as well as usual and customary return precautions.  They verbalize understanding and are comfortable with this plan.  Outpatient follow-up has been provided as needed.  All questions have been answered.   CONSULTS:  none   OUTSIDE RECORDS REVIEWED: Reviewed recent family medicine notes.       FINAL CLINICAL IMPRESSION(S) / ED DIAGNOSES   Final diagnoses:  Atypical chest pain     Rx / DC Orders   ED Discharge Orders          Ordered    ketorolac  (TORADOL ) 10 MG tablet  Every 8 hours PRN        11/10/23 0247    ondansetron  (ZOFRAN -ODT) 4 MG disintegrating tablet  Every 6 hours PRN        11/10/23 0247             Note:  This document was prepared using Dragon voice recognition software and may include unintentional dictation errors.   Macall Mccroskey, Josette SAILOR, DO 11/10/23 [phone]

## 2023-11-10 NOTE — ED Triage Notes (Signed)
 Patient ambulatory to triage with complaints of chest pain that started around 2pm yesterday. States it has continued and felt like it was getting worse with accompanying shortness of breath, nausea, and fatigue.

## 2024-01-08 ENCOUNTER — Ambulatory Visit
Admission: EM | Admit: 2024-01-08 | Discharge: 2024-01-08 | Disposition: A | Discharge status: Home / Self Care | Attending: Family Medicine | Admitting: Family Medicine

## 2024-01-08 ENCOUNTER — Encounter: Payer: Self-pay | Admitting: Emergency Medicine

## 2024-01-08 DIAGNOSIS — J069 Acute upper respiratory infection, unspecified: Secondary | ICD-10-CM | POA: Diagnosis not present

## 2024-01-08 DIAGNOSIS — R0981 Nasal congestion: Secondary | ICD-10-CM | POA: Diagnosis not present

## 2024-01-08 DIAGNOSIS — R051 Acute cough: Secondary | ICD-10-CM

## 2024-01-08 LAB — POCT INFLUENZA A/B
Influenza A, POC: NEGATIVE
Influenza B, POC: NEGATIVE

## 2024-01-08 LAB — POC SOFIA SARS ANTIGEN FIA: SARS Coronavirus 2 Ag: NEGATIVE

## 2024-01-08 MED ORDER — DEXAMETHASONE SOD PHOSPHATE PF 10 MG/ML IJ SOLN
10.0000 mg | Freq: Once | INTRAMUSCULAR | Status: AC
  Administered 2024-01-08: 10 mg via INTRAMUSCULAR

## 2024-01-08 NOTE — Discharge Instructions (Signed)
 You may use over the counter AFRIN nasal spray for your nasal congestion. This medication is for use in the nose. Take it as directed on the label. Shake well before using. Do not use it more often than directed. Do not use for more than 3 days in a row without talking to your care team first. Make sure that you are using your nasal spray correctly.

## 2024-01-08 NOTE — ED Triage Notes (Signed)
 Pt c/o cough, nasal congestion runny nose, right ear pain, sinus pain/pressure.  Started about 2 days ago. He states he has been taking OTC medications without relief. Unsure if he has had a fever.

## 2024-01-08 NOTE — ED Provider Notes (Addendum)
 " [facility]   [id number] 01/08/24 Arrival Time: 1156  ASSESSMENT & PLAN:  1. Acute cough   2. Viral upper respiratory tract infection   3. Nasal congestion    Discussed typical duration of likely viral illness. Results for orders placed or performed during the hospital encounter of 01/08/24  POC SARS Coronavirus Ag   Collection Time: 01/08/24  2:01 PM  Result Value Ref Range   SARS Coronavirus 2 Ag Negative Negative  POC Influenza A/B   Collection Time: 01/08/24  2:01 PM  Result Value Ref Range   Influenza A, POC Negative Negative   Influenza B, POC Negative Negative   OTC symptom care as needed.  Meds ordered this encounter  Medications   dexamethasone  (DECADRON ) injection 10 mg     Discharge Instructions      You may use over the counter AFRIN nasal spray for your nasal congestion. This medication is for use in the nose. Take it as directed on the label. Shake well before using. Do not use it more often than directed. Do not use for more than 3 days in a row without talking to your care team first. Make sure that you are using your nasal spray correctly.         Follow-up Information     Wendee Lynwood HERO, NP.   Specialties: Nurse Practitioner, Family Medicine Why: As needed. Contact information: [address] [place] [address] [phone]         [facility] .   Specialty: Urgent Care Why: If worsening or failing to improve as anticipated. Contact information: [address] [place]  [phone]                Reviewed expectations re: course of current medical issues. Questions answered. Outlined signs and symptoms indicating need for more acute intervention. Understanding verbalized. After Visit Summary given.   SUBJECTIVE: History from: Patient. Cameron Thompson is a 43 y.o. male. Pt c/o cough, nasal congestion runny nose, right ear pain, sinus  pain/pressure.  Started about 2 days ago. He states he has been taking OTC medications without relief. Unsure if he has had a fever.  Denies: difficulty breathing. Normal PO intake without n/v/d.  OBJECTIVE:  Vitals:   01/08/24 1339 01/08/24 1341  BP:  (!) 152/91  Pulse:  97  Resp:  18  Temp:  98.4 F (36.9 C)  TempSrc:  Oral  SpO2:  94%  Weight: (!) 190.5 kg   Height: 5' 9 (1.753 m)     General appearance: alert; no distress Eyes: PERRLA; EOMI; conjunctiva normal HENT: [place]; AT; with nasal congestion; bilat serous OM Neck: supple  Lungs: speaks full sentences without difficulty; unlabored Extremities: no edema Skin: warm and dry Neurologic: normal gait Psychological: alert and cooperative; normal mood and affect  Labs: Results for orders placed or performed during the hospital encounter of 01/08/24  POC SARS Coronavirus Ag   Collection Time: 01/08/24  2:01 PM  Result Value Ref Range   SARS Coronavirus 2 Ag Negative Negative  POC Influenza A/B   Collection Time: 01/08/24  2:01 PM  Result Value Ref Range   Influenza A, POC Negative Negative   Influenza B, POC Negative Negative   Labs Reviewed  POC SOFIA SARS ANTIGEN FIA - Normal  POCT INFLUENZA A/B - Normal    Imaging: No results found.  Allergies[1]  Past Medical History:  Diagnosis Date   Allergy  Asthma    pollen season   Gout    History of kidney stones    Obese    Vitamin D  deficiency    Social History   Socioeconomic History   Marital status: Single    Spouse name: Not on file   Number of children: 0   Years of education: Not on file   Highest education level: Some college, no degree  Occupational History   Not on file  Tobacco Use   Smoking status: Never   Smokeless tobacco: Never  Vaping Use   Vaping status: Never Used  Substance and Sexual Activity   Alcohol use: Yes    Comment: occasionally   Drug use: No   Sexual activity: Not on file  Other Topics Concern   Not on file   Social History Narrative   Fulltime: Customer solutions representative      Hobbies: puzzles, swimming,    Social Drivers of Health   Tobacco Use: Low Risk (01/08/2024)   Patient History    Smoking Tobacco Use: Never    Smokeless Tobacco Use: Never    Passive Exposure: Not on file  Financial Resource Strain: Medium Risk (07/01/2023)   Overall Financial Resource Strain (CARDIA)    Difficulty of Paying Living Expenses: Somewhat hard  Food Insecurity: Food Insecurity Present (07/01/2023)   Epic    Worried About Programme Researcher, Broadcasting/film/video in the Last Year: Sometimes true    Ran Out of Food in the Last Year: Often true  Transportation Needs: No Transportation Needs (07/01/2023)   Epic    Lack of Transportation (Medical): No    Lack of Transportation (Non-Medical): No  Physical Activity: Inactive (07/01/2023)   Exercise Vital Sign    Days of Exercise per Week: 0 days    Minutes of Exercise per Session: Not on file  Stress: Stress Concern Present (07/01/2023)   [employer] of Occupational Health - Occupational Stress Questionnaire    Feeling of Stress: Very much  Social Connections: Unknown (07/01/2023)   Social Connection and Isolation Panel    Frequency of Communication with Friends and Family: Never    Frequency of Social Gatherings with Friends and Family: Patient declined    Attends Religious Services: Never    Database Administrator or Organizations: No    Attends Engineer, Structural: Not on file    Marital Status: Patient declined  Intimate Partner Violence: Not on file  Depression (PHQ2-9): High Risk (10/01/2023)   Depression (PHQ2-9)    PHQ-2 Score: 15  Alcohol Screen: Low Risk (07/01/2023)   Alcohol Screen    Last Alcohol Screening Score (AUDIT): 3  Housing: Low Risk (07/01/2023)   Epic    Unable to Pay for Housing in the Last Year: No    Number of Times Moved in the Last Year: 1    Homeless in the Last Year: No  Utilities: Not on file  Health Literacy: Not on  file   Family History  Problem Relation Age of Onset   Pulmonary embolism Mother    Cancer Maternal Grandmother        lung   Diabetes Paternal Grandmother    Past Surgical History:  Procedure Laterality Date   NO PAST SURGERIES     NO PAST SURGERIES     VENTRAL HERNIA REPAIR N/A 03/03/2019   Procedure: HERNIA REPAIR UMBILICAL ADULT;  Surgeon: Dessa Reyes ORN, MD;  Location: [facility] ORS;  Service: General;  Laterality: N/A;     Alazia Crocket,  [place], MD 01/08/24 1419     [1]  Allergies Allergen Reactions   Kiwi Extract Anaphylaxis     Rolinda Redell, MD 01/08/24 1420  "

## 2024-03-30 ENCOUNTER — Ambulatory Visit: Admitting: Nurse Practitioner
